# Patient Record
Sex: Female | Born: 1935 | Race: White | Hispanic: No | Smoking: Former smoker
Health system: Southern US, Community
[De-identification: ages and names within clinical notes are randomized; demographics above are authoritative.]

## PROBLEM LIST (undated history)

## (undated) DIAGNOSIS — C4492 Squamous cell carcinoma of skin, unspecified: Secondary | ICD-10-CM

## (undated) DIAGNOSIS — N183 Chronic kidney disease, stage 3 unspecified: Secondary | ICD-10-CM

## (undated) DIAGNOSIS — K5790 Diverticulosis of intestine, part unspecified, without perforation or abscess without bleeding: Secondary | ICD-10-CM

## (undated) DIAGNOSIS — G44009 Cluster headache syndrome, unspecified, not intractable: Secondary | ICD-10-CM

## (undated) DIAGNOSIS — M4800 Spinal stenosis, site unspecified: Secondary | ICD-10-CM

## (undated) DIAGNOSIS — N6009 Solitary cyst of unspecified breast: Secondary | ICD-10-CM

## (undated) DIAGNOSIS — N200 Calculus of kidney: Secondary | ICD-10-CM

## (undated) DIAGNOSIS — F0390 Unspecified dementia without behavioral disturbance: Secondary | ICD-10-CM

## (undated) DIAGNOSIS — R319 Hematuria, unspecified: Secondary | ICD-10-CM

## (undated) DIAGNOSIS — R413 Other amnesia: Secondary | ICD-10-CM

## (undated) DIAGNOSIS — M1990 Unspecified osteoarthritis, unspecified site: Secondary | ICD-10-CM

## (undated) DIAGNOSIS — E785 Hyperlipidemia, unspecified: Secondary | ICD-10-CM

## (undated) DIAGNOSIS — M8580 Other specified disorders of bone density and structure, unspecified site: Secondary | ICD-10-CM

## (undated) DIAGNOSIS — I10 Essential (primary) hypertension: Secondary | ICD-10-CM

## (undated) DIAGNOSIS — F419 Anxiety disorder, unspecified: Secondary | ICD-10-CM

## (undated) HISTORY — DX: Hematuria, unspecified: R31.9

## (undated) HISTORY — DX: Solitary cyst of unspecified breast: N60.09

## (undated) HISTORY — DX: Hyperlipidemia, unspecified: E78.5

## (undated) HISTORY — DX: Chronic kidney disease, stage 3 unspecified: N18.30

## (undated) HISTORY — DX: Diverticulosis of intestine, part unspecified, without perforation or abscess without bleeding: K57.90

## (undated) HISTORY — DX: Spinal stenosis, site unspecified: M48.00

## (undated) HISTORY — DX: Anxiety disorder, unspecified: F41.9

## (undated) HISTORY — DX: Unspecified osteoarthritis, unspecified site: M19.90

## (undated) HISTORY — DX: Squamous cell carcinoma of skin, unspecified: C44.92

## (undated) HISTORY — DX: Cluster headache syndrome, unspecified, not intractable: G44.009

## (undated) HISTORY — DX: Other specified disorders of bone density and structure, unspecified site: M85.80

## (undated) HISTORY — DX: Essential (primary) hypertension: I10

## (undated) HISTORY — DX: Chronic kidney disease, stage 3 (moderate): N18.3

## (undated) HISTORY — DX: Other amnesia: R41.3

## (undated) HISTORY — DX: Calculus of kidney: N20.0

## (undated) HISTORY — PX: APPENDECTOMY: SHX54

---

## 1968-05-17 DIAGNOSIS — N200 Calculus of kidney: Secondary | ICD-10-CM

## 1968-05-17 HISTORY — DX: Calculus of kidney: N20.0

## 1975-05-18 HISTORY — PX: CHOLECYSTECTOMY: SHX55

## 1993-01-15 DIAGNOSIS — N6009 Solitary cyst of unspecified breast: Secondary | ICD-10-CM

## 1993-01-15 HISTORY — DX: Solitary cyst of unspecified breast: N60.09

## 1997-08-29 ENCOUNTER — Other Ambulatory Visit: Admission: RE | Admit: 1997-08-29 | Discharge: 1997-08-29 | Payer: Self-pay | Admitting: Obstetrics and Gynecology

## 1998-09-15 ENCOUNTER — Other Ambulatory Visit: Admission: RE | Admit: 1998-09-15 | Discharge: 1998-09-15 | Payer: Self-pay | Admitting: Internal Medicine

## 1999-06-09 ENCOUNTER — Encounter: Payer: Self-pay | Admitting: Family Medicine

## 1999-06-09 ENCOUNTER — Encounter: Admission: RE | Admit: 1999-06-09 | Discharge: 1999-06-09 | Payer: Self-pay | Admitting: Family Medicine

## 1999-06-12 ENCOUNTER — Encounter: Admission: RE | Admit: 1999-06-12 | Discharge: 1999-06-12 | Payer: Self-pay | Admitting: Family Medicine

## 1999-06-12 ENCOUNTER — Encounter: Payer: Self-pay | Admitting: Family Medicine

## 1999-10-08 ENCOUNTER — Other Ambulatory Visit: Admission: RE | Admit: 1999-10-08 | Discharge: 1999-10-08 | Payer: Self-pay | Admitting: Obstetrics and Gynecology

## 2000-10-11 ENCOUNTER — Other Ambulatory Visit: Admission: RE | Admit: 2000-10-11 | Discharge: 2000-10-11 | Payer: Self-pay | Admitting: Obstetrics and Gynecology

## 2001-10-12 ENCOUNTER — Other Ambulatory Visit: Admission: RE | Admit: 2001-10-12 | Discharge: 2001-10-12 | Payer: Self-pay | Admitting: Obstetrics and Gynecology

## 2004-01-10 ENCOUNTER — Other Ambulatory Visit: Admission: RE | Admit: 2004-01-10 | Discharge: 2004-01-10 | Payer: Self-pay | Admitting: Obstetrics and Gynecology

## 2005-04-28 ENCOUNTER — Other Ambulatory Visit: Admission: RE | Admit: 2005-04-28 | Discharge: 2005-04-28 | Payer: Self-pay | Admitting: Obstetrics and Gynecology

## 2006-08-18 ENCOUNTER — Other Ambulatory Visit: Admission: RE | Admit: 2006-08-18 | Discharge: 2006-08-18 | Payer: Self-pay | Admitting: Obstetrics & Gynecology

## 2006-09-29 ENCOUNTER — Encounter: Admission: RE | Admit: 2006-09-29 | Discharge: 2006-09-29 | Payer: Self-pay | Admitting: Rheumatology

## 2007-08-24 ENCOUNTER — Other Ambulatory Visit: Admission: RE | Admit: 2007-08-24 | Discharge: 2007-08-24 | Payer: Self-pay | Admitting: Obstetrics & Gynecology

## 2007-12-28 ENCOUNTER — Ambulatory Visit (HOSPITAL_COMMUNITY): Admission: RE | Admit: 2007-12-28 | Discharge: 2007-12-28 | Payer: Self-pay | Admitting: Internal Medicine

## 2008-09-02 ENCOUNTER — Other Ambulatory Visit: Admission: RE | Admit: 2008-09-02 | Discharge: 2008-09-02 | Payer: Self-pay | Admitting: Obstetrics & Gynecology

## 2008-12-17 ENCOUNTER — Encounter: Admission: RE | Admit: 2008-12-17 | Discharge: 2008-12-17 | Payer: Self-pay | Admitting: Neurology

## 2008-12-18 ENCOUNTER — Encounter: Admission: RE | Admit: 2008-12-18 | Discharge: 2009-02-11 | Payer: Self-pay | Admitting: Neurology

## 2009-08-15 DIAGNOSIS — C4492 Squamous cell carcinoma of skin, unspecified: Secondary | ICD-10-CM

## 2009-08-15 HISTORY — DX: Squamous cell carcinoma of skin, unspecified: C44.92

## 2009-08-15 HISTORY — PX: SKIN BIOPSY: SHX1

## 2010-09-15 HISTORY — PX: BREAST BIOPSY: SHX20

## 2010-09-23 ENCOUNTER — Other Ambulatory Visit: Payer: Self-pay | Admitting: Radiology

## 2011-02-12 LAB — COMPREHENSIVE METABOLIC PANEL
ALT: 32
Alkaline Phosphatase: 52
CO2: 29
Chloride: 97
Glucose, Bld: 116 — ABNORMAL HIGH
Potassium: 4.3
Sodium: 135
Total Protein: 6.5

## 2011-02-12 LAB — CBC
Hemoglobin: 12.7
RBC: 4
WBC: 6.3

## 2011-07-09 ENCOUNTER — Encounter: Payer: Self-pay | Admitting: Internal Medicine

## 2011-09-06 ENCOUNTER — Telehealth: Payer: Self-pay | Admitting: Internal Medicine

## 2011-09-06 NOTE — Telephone Encounter (Signed)
Left a message for Alicia Harrison to call me. 

## 2011-09-07 NOTE — Telephone Encounter (Signed)
Spoke with Alicia Harrison and scheduled patient with Willette Cluster, NP on 09/08/11 at 10:30 AM. Alicia Harrison to fax records.

## 2011-09-08 ENCOUNTER — Other Ambulatory Visit (INDEPENDENT_AMBULATORY_CARE_PROVIDER_SITE_OTHER): Payer: Medicare Other

## 2011-09-08 ENCOUNTER — Encounter: Payer: Self-pay | Admitting: Nurse Practitioner

## 2011-09-08 ENCOUNTER — Ambulatory Visit (INDEPENDENT_AMBULATORY_CARE_PROVIDER_SITE_OTHER): Payer: Medicare Other | Admitting: Nurse Practitioner

## 2011-09-08 VITALS — BP 122/70 | HR 72 | Ht 66.0 in | Wt 141.0 lb

## 2011-09-08 DIAGNOSIS — K921 Melena: Secondary | ICD-10-CM

## 2011-09-08 LAB — CBC WITH DIFFERENTIAL/PLATELET
Eosinophils Absolute: 0.1 10*3/uL (ref 0.0–0.7)
Eosinophils Relative: 0.3 % (ref 0.0–5.0)
MCV: 94.4 fl (ref 78.0–100.0)
Monocytes Absolute: 0.8 10*3/uL (ref 0.1–1.0)
Neutrophils Relative %: 86.1 % — ABNORMAL HIGH (ref 43.0–77.0)
Platelets: 281 10*3/uL (ref 150.0–400.0)
WBC: 18.3 10*3/uL (ref 4.5–10.5)

## 2011-09-08 NOTE — Progress Notes (Signed)
09/08/2011 Alicia Harrison [id number] 01-May-1926   HISTORY OF PRESENT ILLNESS: Patient is an 78 year old female referred by her PCP for evaluation of heme positive stools. Patient had a screening colonoscopy by Dr. Juanda Chance in 2003. Findings included only diverticulosis. Patient has no GI complaints such as abdominal pain, nausea, weight loss or overt GI bleeding.   Past Medical History  Diagnosis Date  . Hyperlipemia   . Spinal stenosis   . Cluster headaches   . Kidney stones 1970  . Osteopenia   . Allergic rhinitis   . Chronic kidney disease (CKD), stage III (moderate)    Past Surgical History  Procedure Date  . Cholecystectomy 1977    reports that she has quit smoking. She has never used smokeless tobacco. She reports that she drinks alcohol. She reports that she does not use illicit drugs. family history includes Melanoma in her mother. No Known Allergies    Outpatient Encounter Prescriptions as of 09/08/2011  Medication Sig Dispense Refill  . etodolac (LODINE) 400 MG tablet Take 400 mg by mouth daily.      . hydrochlorothiazide (HYDRODIURIL) 25 MG tablet Take 25 mg by mouth daily.      Marland Kitchen loratadine (CLARITIN) 10 MG tablet Take 10 mg by mouth daily.         REVIEW OF SYSTEMS  : All other systems reviewed and negative except where noted in the History of Present Illness.   PHYSICAL EXAM: BP 122/70  Pulse 72  Ht 5\' 6"  (1.676 m)  Wt 141 lb (63.957 kg)  BMI 22.76 kg/m2 General: Well developed white female in no acute distress Head: Normocephalic and atraumatic Eyes:  sclerae anicteric,conjunctive pink. Ears: Normal auditory acuity Mouth: No deformity or lesions Neck: Supple, no masses.  Lungs: Clear throughout to auscultation Heart: Regular rate and rhythm Abdomen: Soft, non distended, nontender. No masses or hepatomegaly noted. Normal Bowel sounds Rectal: light brown heme negative stool Musculoskeletal: Symmetrical with no gross deformities  Skin: No lesions on  visible extremities Extremities: No edema or deformities noted Neurological: Alert oriented, grossly nonfocal Cervical Nodes:  No significant cervical adenopathy Psychological:  Alert and cooperative. Normal mood and affect  ASSESSMENT AND PLAN;   Heme positive stool (positive Hemosure by PCP), normal hemoglobin in 13 range a couple of weeks ago. Patient heme negative on exam today. Will recheck her hemoglobin to make sure it isn't drifting. Patient takes NSAIDS so she could have intermittent blood loss from erosions / ulcers. Patient is due for her screening colonoscopy but she really isn't interested. We will call her with lab results. If her hemoglobin has drifted she may want to reconsider having a colonoscopy and possibly and upper endoscopy as well.

## 2011-09-08 NOTE — Patient Instructions (Signed)
Please go to the basement level to have your labs drawn.  We will call you with the results. 

## 2011-09-14 ENCOUNTER — Other Ambulatory Visit: Payer: Self-pay | Admitting: *Deleted

## 2011-09-14 DIAGNOSIS — K921 Melena: Secondary | ICD-10-CM

## 2011-09-14 NOTE — Progress Notes (Signed)
I agree with assessment and plan.

## 2011-09-30 ENCOUNTER — Telehealth: Payer: Self-pay | Admitting: *Deleted

## 2011-09-30 NOTE — Telephone Encounter (Signed)
Spoke with patient and she will come next week for labs. 

## 2011-09-30 NOTE — Telephone Encounter (Signed)
Message copied by Daphine Deutscher on Thu Sep 30, 2011  9:23 AM ------      Message from: Daphine Deutscher      Created: Thu Sep 16, 2011  9:37 AM       Call and remind patient due for CBC on 5/20(PG()

## 2011-10-05 ENCOUNTER — Other Ambulatory Visit (INDEPENDENT_AMBULATORY_CARE_PROVIDER_SITE_OTHER): Payer: Medicare Other

## 2011-10-05 DIAGNOSIS — K921 Melena: Secondary | ICD-10-CM

## 2011-10-05 LAB — CBC WITH DIFFERENTIAL/PLATELET
Basophils Absolute: 0 10*3/uL (ref 0.0–0.1)
Eosinophils Absolute: 0.2 10*3/uL (ref 0.0–0.7)
Hemoglobin: 12.6 g/dL (ref 12.0–15.0)
Lymphocytes Relative: 25.9 % (ref 12.0–46.0)
MCHC: 33 g/dL (ref 30.0–36.0)
Monocytes Absolute: 0.5 10*3/uL (ref 0.1–1.0)
Neutro Abs: 4.4 10*3/uL (ref 1.4–7.7)
RDW: 13.6 % (ref 11.5–14.6)

## 2012-01-04 ENCOUNTER — Ambulatory Visit (INDEPENDENT_AMBULATORY_CARE_PROVIDER_SITE_OTHER): Payer: Medicare Other | Admitting: Internal Medicine

## 2012-01-04 VITALS — BP 118/70 | HR 83 | Temp 97.7°F | Resp 16 | Ht 66.0 in | Wt 138.0 lb

## 2012-01-04 DIAGNOSIS — S81809A Unspecified open wound, unspecified lower leg, initial encounter: Secondary | ICD-10-CM

## 2012-01-04 DIAGNOSIS — IMO0002 Reserved for concepts with insufficient information to code with codable children: Secondary | ICD-10-CM

## 2012-01-04 DIAGNOSIS — L03119 Cellulitis of unspecified part of limb: Secondary | ICD-10-CM

## 2012-01-04 MED ORDER — MUPIROCIN 2 % EX OINT: TOPICAL_OINTMENT | Freq: Three times a day (TID) | CUTANEOUS | Status: AC

## 2012-01-04 MED ORDER — DOXYCYCLINE HYCLATE 100 MG PO TABS: 100.0000 mg | ORAL_TABLET | Freq: Two times a day (BID) | ORAL | Status: AC

## 2012-01-04 NOTE — Patient Instructions (Signed)
Wound Care Wound care helps prevent pain and infection.  You may need a tetanus shot if:  You cannot remember when you had your last tetanus shot.   You have never had a tetanus shot.   The injury broke your skin.  If you need a tetanus shot and you choose not to have one, you may get tetanus. Sickness from tetanus can be serious. HOME CARE   Only take medicine as told by your doctor.   Clean the wound daily with mild soap and water.   Change any bandages (dressings) as told by your doctor.   Put medicated cream and a bandage on the wound as told by your doctor.   Change the bandage if it gets wet, dirty, or starts to smell.   Take showers. Do not take baths, swim, or do anything that puts your wound under water.   Rest and raise (elevate) the wound until the pain and puffiness (swelling) are better.   Keep all doctor visits as told.  GET HELP RIGHT AWAY IF:   Yellowish-white fluid (pus) comes from the wound.   Medicine does not lessen your pain.   There is a red streak going away from the wound.   You cannot move your finger or toe.   You have a fever.  MAKE SURE YOU:   Understand these instructions.   Will watch your condition.   Will get help right away if you are not doing well or get worse.  Document Released: 02/10/2008 Document Revised: 04/22/2011 Document Reviewed: 09/06/2010 [employer] Patient Information 2012 [employer]. 

## 2012-01-04 NOTE — Progress Notes (Signed)
  Subjective:    Patient ID: Alicia Harrison, female    DOB: [birth year], 78 y.o.   MRN: [id number]  HPI 12 days ago hit shin on a rock, has a deep abrasion,no pain, started turning red around edges and came in for evaluation. NMS intact   Review of Systems arthritis    Objective:   Physical Exam  Skin: Skin is warm and dry. Abrasion and laceration noted. There is erythema.          Healing wound with possible cellulitis mild on edges   Right leg wound  Soap and water/mupirocin and cover. TD utd     Assessment & Plan:  Daily wound care Mupirocin/doxy

## 2012-09-11 ENCOUNTER — Encounter: Payer: Self-pay | Admitting: *Deleted

## 2012-09-12 ENCOUNTER — Encounter: Payer: Self-pay | Admitting: *Deleted

## 2012-10-24 ENCOUNTER — Encounter: Payer: Self-pay | Admitting: Internal Medicine

## 2012-10-24 ENCOUNTER — Ambulatory Visit (INDEPENDENT_AMBULATORY_CARE_PROVIDER_SITE_OTHER): Payer: Medicare Other | Admitting: Internal Medicine

## 2012-10-24 VITALS — BP 102/68 | HR 76 | Ht 66.0 in | Wt 136.8 lb

## 2012-10-24 DIAGNOSIS — K921 Melena: Secondary | ICD-10-CM

## 2012-10-24 DIAGNOSIS — R195 Other fecal abnormalities: Secondary | ICD-10-CM

## 2012-10-24 HISTORY — PX: COLONOSCOPY: SHX174

## 2012-10-24 MED ORDER — PEG-KCL-NACL-NASULF-NA ASC-C 100 G PO SOLR: 1.0000 | Freq: Once | ORAL | Status: DC

## 2012-10-24 NOTE — Patient Instructions (Addendum)
It has been recommended to you by your physician that you have a(n) Colonoscopy completed. Per your request, we did not schedule the procedure(s) today. Please contact our office at [phone] should you decide to have the procedure completed.    Cc:Dr Tisovec

## 2012-10-24 NOTE — Progress Notes (Signed)
Alicia Harrison 08-16-1925 MRN [id number]        History of Present Illness:  This is a 79 year old white female with a Hemoccult positive stool on Hemosure. She was here one year ago and saw Gunnar Fusi for the same reason of having Hemoccult-positive stool. She at that time declined colonoscopy. He has occasional constipation which bothers her because of spinal stenosis. She does not take any laxatives except for fiber.and yogurt. She denies visible blood per rectum. Her appetite has been good and she denies any nausea vomiting or weight changes. She and her husband leaving for [place] for 3 weeks and will be back in mid July. There is no family history of colon cancer .   Past Medical History  Diagnosis Date  . Hyperlipemia   . Spinal stenosis   . Cluster headaches   . Kidney stones 1970  . Osteopenia   . Allergic rhinitis   . Chronic kidney disease (CKD), stage III (moderate)   . Diverticulosis    Past Surgical History  Procedure Laterality Date  . Cholecystectomy  1977    reports that she has quit smoking. She has never used smokeless tobacco. She reports that  drinks alcohol. She reports that she does not use illicit drugs. family history includes Melanoma in her mother and Schizophrenia in her son. No Known Allergies      Review of Systems:Denies heartburn dysphagia abdominal pain  The remainder of the 10 point ROS is negative except as outlined in H&P   Physical Exam: General appearance  Well developed, in no distress. Eyes- non icteric. HEENT nontraumatic, normocephalic. Mouth no lesions, tongue papillated, no cheilosis. Neck supple without adenopathy, thyroid not enlarged, no carotid bruits, no JVD. Lungs Clear to auscultation bilaterally. Cor normal S1, normal S2, regular rhythm, no murmur,  quiet precordium. Abdomen:Soft relaxed with normoactive bowel sounds. Prominent pulsating aorta without bruits. Liver edge at costal margin. Minimal tenderness surrounding the  aorta. Lower abdomen unremarkable without any mass  Rectal:Soft Hemoccult negative stool  Extremities no pedal edema. Skin no lesions. Neurological alert and oriented x 3. Psychological normal mood and affect.  Assessment and Plan:  79 year old white female with  2 home tests possitive for occult blood in April  2013 and again  this year. She  declined colonoscopy in 2013 .  She is not anemic. Her hemoglobin 13.0 and there is no family history of colon cancer. I could not reproduce the Hemoccult-positive stool on today's exam. She has mild renal insufficiency with creatinine 1.6 and BUN 28. She takes Lodine 400 mg by mouth daily which could cause gastropathy and heme positive stool. She takes Lodine forchronic low back pain together with Vicodin. And 81 mg aspirin. I have discussed the prep for colonoscopy as well as sedation and the procedure itself . She decided not to go ahead with colonoscopy. She is concerned about higher risks exceeding the benefits at her age. I have brought up the fact that this is the second time she was found to have blood in her stool. She initially agreed to schedule but then changed her mind. There are currently no plans for screening colonoscopy for this patient   10/24/2012 Lina Sar

## 2012-11-28 ENCOUNTER — Telehealth: Payer: Self-pay | Admitting: *Deleted

## 2012-11-28 NOTE — Telephone Encounter (Signed)
Per Dr. Edward Jolly may take out of hold mammogram/ 11/21/2012/sue

## 2012-12-06 ENCOUNTER — Encounter: Payer: Medicare Other | Admitting: Internal Medicine

## 2013-01-22 ENCOUNTER — Encounter: Payer: Self-pay | Admitting: Obstetrics & Gynecology

## 2013-01-22 ENCOUNTER — Ambulatory Visit (INDEPENDENT_AMBULATORY_CARE_PROVIDER_SITE_OTHER): Payer: Medicare Other | Admitting: Obstetrics & Gynecology

## 2013-01-22 VITALS — BP 128/76 | HR 64 | Resp 20 | Ht 66.0 in | Wt 133.0 lb

## 2013-01-22 DIAGNOSIS — Z01419 Encounter for gynecological examination (general) (routine) without abnormal findings: Secondary | ICD-10-CM

## 2013-01-22 DIAGNOSIS — M8580 Other specified disorders of bone density and structure, unspecified site: Secondary | ICD-10-CM

## 2013-01-22 DIAGNOSIS — M899 Disorder of bone, unspecified: Secondary | ICD-10-CM

## 2013-01-22 NOTE — Patient Instructions (Addendum)

## 2013-01-22 NOTE — Progress Notes (Signed)
79 y.o. Alicia Harrison MarriedCaucasianF here for annual exam.  Doing well.  No vaginal bleeding.  Hasn't been here in 2 years.  Needs BMD and then we will discuss if she needs to restart her Fosamax.  Dr. Wylene Simmer this summer.  Blood work was all okay.  She and spouse went to [place] this summer.  Husband fell the first day they were in [place] and husband fell on an escalator and ended up in ER for stiches.  Went with son and daughter-in-law.   Patient's last menstrual period was 05/17/1988.          Sexually active: no  The current method of family planning is post menopausal status.    Exercising: yes  physical therapy 3 x weekly for 1 hour Smoker:  no  Health Maintenance: Pap:  09/21/10 WNL History of abnormal Pap:  no MMG: 10/10/12-additional views-normal Colonoscopy:  2003, declines another colonoscopy BMD:   4/10 off fosamax x 2 years TDaP:  2011 Screening Labs: PCP, Hb today: PCP, Urine today: PCP   reports that she has quit smoking. She has never used smokeless tobacco. She reports that  drinks alcohol. She reports that she does not use illicit drugs.  Past Medical History  Diagnosis Date  . Hyperlipemia   . Spinal stenosis   . Cluster headaches   . Kidney stones 1970  . Osteopenia   . Allergic rhinitis   . Chronic kidney disease (CKD), stage III (moderate)   . Diverticulosis   . SCC (squamous cell carcinoma) 4/11    of face and leg  . Osteoarthritis   . Hypertension   . Breast cyst 9/94  . Hematuria     Past Surgical History  Procedure Laterality Date  . Cholecystectomy  1977  . Breast biopsy  5/12    stromal fibrosis and microcalcifications  . Appendectomy    . Skin biopsy  4/11     squamous cell ca    Current Outpatient Prescriptions  Medication Sig Dispense Refill  . aspirin 81 MG tablet Take 81 mg by mouth daily.      . Calcium Carbonate-Vitamin D (CALCIUM-D PO) Take 600 mg by mouth 2 (two) times daily.      Marland Kitchen etodolac (LODINE) 400 MG tablet Take 400 mg by mouth  daily.      . hydrochlorothiazide (HYDRODIURIL) 25 MG tablet Take 25 mg by mouth daily.      Marland Kitchen HYDROcodone-acetaminophen (VICODIN) 5-500 MG per tablet Take 1 tablet by mouth every 6 (six) hours as needed for pain.      Marland Kitchen loratadine (CLARITIN) 10 MG tablet Take 10 mg by mouth daily.       No current facility-administered medications for this visit.    Family History  Problem Relation Age of Onset  . Melanoma Mother   . Schizophrenia Son   . Heart attack Maternal Grandfather     ROS:  Pertinent items are noted in HPI.  Otherwise, a comprehensive ROS was negative.  Exam:   BP 128/76  Pulse 64  Resp 20  Ht 5\' 6"  (1.676 m)  Wt 133 lb (60.328 kg)  BMI 21.48 kg/m2  LMP 05/17/1988  Weight change: @WEIGHTCHANGE @ Height:   Height: 5\' 6"  (167.6 cm) (with shoes)  Ht Readings from Last 3 Encounters:  01/22/13 5\' 6"  (1.676 m)  10/24/12 5\' 6"  (1.676 m)  01/04/12 5\' 6"  (1.676 m)    General appearance: alert, cooperative and appears stated age Head: Normocephalic, without obvious abnormality, atraumatic Neck: no  adenopathy, supple, symmetrical, trachea midline and thyroid normal to inspection and palpation Lungs: clear to auscultation bilaterally Breasts: normal appearance, no masses or tenderness Heart: regular rate and rhythm Abdomen: soft, non-tender; bowel sounds normal; no masses,  no organomegaly Extremities: extremities normal, atraumatic, no cyanosis or edema Skin: Skin color, texture, turgor normal. No rashes or lesions Lymph nodes: Cervical, supraclavicular, and axillary nodes normal. No abnormal inguinal nodes palpated Neurologic: Grossly normal   Pelvic: External genitalia:  no lesions              Urethra:  normal appearing urethra with no masses, tenderness or lesions              Bartholins and Skenes: normal                 Vagina: normal appearing vagina with normal color and discharge, no lesions              Cervix: no lesions              Pap taken: no Bimanual  Exam:  Uterus:  normal size, contour, position, consistency, mobility, non-tender              Adnexa: normal adnexa and no mass, fullness, tenderness               Rectovaginal: Confirms               Anus:  normal sphincter tone, no lesions  A:  Well Woman with normal exam PMP, No HRT Osteopenia Microscopic hematuria, h/o renal calculi  P:   Mammogram yearly pap smear not indicated.  Normal 2012. BMD scheduled for pt 9/18 at 11:30am at [facility] return annually or prn  An After Visit Summary was printed and given to the patient.

## 2013-03-01 ENCOUNTER — Telehealth: Payer: Self-pay | Admitting: Obstetrics & Gynecology

## 2013-03-01 NOTE — Telephone Encounter (Signed)
Patient calling for: Bone density testing results.  Wants to know if she should continue Fosamax.   Called [facility] and requesting copy of DEXA. Should be faxing DEXA at this time.

## 2013-03-01 NOTE — Telephone Encounter (Signed)
Pt calling for results of her colonoscopy.

## 2013-03-02 NOTE — Telephone Encounter (Signed)
Waited for fax yesterday it did not come. Called [facility] again now, states they will fax it when they can.

## 2013-03-06 NOTE — Telephone Encounter (Signed)
Dr.Miller, have you received these results? 

## 2013-03-10 NOTE — Telephone Encounter (Signed)
FYI.  I have signed off everything in my office/files and I do not have this BMD result.  I am sorry.

## 2013-03-12 NOTE — Telephone Encounter (Signed)
Note from Dr. Hyacinth Meeker:   BMD Very stable. Repeat in 3-5 years.   Message left to return call to [place] at [phone].   Will give message from Dr. Hyacinth Meeker.

## 2013-03-12 NOTE — Telephone Encounter (Signed)
Dr. Hyacinth Meeker, patient wants to know if she should continue on Fosamax. She states she was told to hold on taking it and wants to know if she should continue taking it?

## 2013-03-19 NOTE — Telephone Encounter (Signed)
Dr. Hyacinth Meeker, do you want patient to re-start Fosamax?

## 2013-03-26 NOTE — Telephone Encounter (Signed)
Spoke with pt to advise she can take a break from Fosamax, and repeat BMD in 2-3 years. Pt agreeable.

## 2013-03-26 NOTE — Telephone Encounter (Signed)
OK for pt to take drug holiday from Fosamax.  Will repeat BMD 2-3 years.  OK to stop medication now.

## 2013-03-27 ENCOUNTER — Telehealth: Payer: Self-pay | Admitting: Emergency Medicine

## 2013-03-27 NOTE — Telephone Encounter (Signed)
Encounter opened erroneously.   Closed encounter.   

## 2013-04-16 ENCOUNTER — Encounter: Payer: Self-pay | Admitting: Obstetrics & Gynecology

## 2014-02-25 ENCOUNTER — Telehealth: Payer: Self-pay | Admitting: Obstetrics & Gynecology

## 2014-02-25 NOTE — Telephone Encounter (Signed)
Patient is asking if she needs to be seen for her aex this year. Patient thinks she may not need an aex every year.

## 2014-02-26 NOTE — Telephone Encounter (Signed)
Patient was last seen for aex 01/2013. Patient's last pap was in 2012 which was normal. Patient is due for annual exam but would like to know if she needs one yearly at this time. Please advise.

## 2014-02-27 NOTE — Telephone Encounter (Signed)
Left message to call Cathrine Krizan at [phone]. 

## 2014-02-27 NOTE — Telephone Encounter (Signed)
Pt seen 2014.  OK to wait until next year.  She should call with any new pelvic pain symptoms or vaginal bleeding, in particular.  Thanks.

## 2014-03-05 NOTE — Telephone Encounter (Signed)
Spoke with patient. Advised of message as seen below from [place]. Patient is agreeable and will call if she has any pelvic pain, vaginal bleeding, or any other symptoms. Would like to call back next year to schedule aex appointment with [facility].  Routing to provider for final review. Patient agreeable to disposition. Will close encounter

## 2014-03-18 ENCOUNTER — Encounter: Payer: Self-pay | Admitting: Obstetrics & Gynecology

## 2014-08-28 ENCOUNTER — Ambulatory Visit (INDEPENDENT_AMBULATORY_CARE_PROVIDER_SITE_OTHER): Payer: Medicare Other | Admitting: Emergency Medicine

## 2014-08-28 VITALS — BP 126/74 | HR 90 | Temp 97.9°F | Resp 18 | Ht 68.0 in | Wt 128.0 lb

## 2014-08-28 DIAGNOSIS — J0140 Acute pansinusitis, unspecified: Secondary | ICD-10-CM

## 2014-08-28 MED ORDER — AMOXICILLIN-POT CLAVULANATE 875-125 MG PO TABS: 1.0000 | ORAL_TABLET | Freq: Two times a day (BID) | ORAL | Status: DC

## 2014-08-28 NOTE — Patient Instructions (Signed)

## 2014-08-28 NOTE — Progress Notes (Signed)
[facility] [address], [place] [address] [phone]  Date:  08/28/2014   Name:  Alicia Harrison   DOB:  08-Aug-1925   MRN:  [id number]  PCP:  Haywood Pao, MD    Chief Complaint: Nasal Congestion   History of Present Illness:  Alicia Harrison is a 81 y.o. very pleasant female patient who presents with the following:  Ill with nasal congestion and pressure in cheeks and forehead Post nasal drainage and purulent discharge No wheezing or shortness of breath No fever or chills.  No nausea or vomiting Husband ill with similar. No improvement with over the counter medications or other home remedies.  Denies other complaint or health concern today.   Patient Active Problem List   Diagnosis Date Noted  . Blood in stool 09/08/2011    Past Medical History  Diagnosis Date  . Hyperlipemia   . Spinal stenosis   . Cluster headaches   . Kidney stones 1970  . Osteopenia   . Allergic rhinitis   . Chronic kidney disease (CKD), stage III (moderate)   . Diverticulosis   . SCC (squamous cell carcinoma) 4/11    of face and leg  . Osteoarthritis   . Hypertension   . Breast cyst 9/94  . Hematuria     evaluation showed stones but still with hematuria  . Anxiety     Past Surgical History  Procedure Laterality Date  . Cholecystectomy  1977  . Breast biopsy  5/12    stromal fibrosis and microcalcifications  . Appendectomy    . Skin biopsy  4/11     squamous cell ca    History  Substance Use Topics  . Smoking status: Former Research scientist (life sciences)  . Smokeless tobacco: Never Used  . Alcohol Use: Yes    Family History  Problem Relation Age of Onset  . Melanoma Mother   . Schizophrenia Son   . Heart attack Maternal Grandfather     No Known Allergies  Medication list has been reviewed and updated.  Current Outpatient Prescriptions on File Prior to Visit  Medication Sig Dispense Refill  . aspirin 81 MG tablet Take 81 mg by mouth daily.    . Calcium  Carbonate-Vitamin D (CALCIUM-D PO) Take 600 mg by mouth 2 (two) times daily.    Marland Kitchen etodolac (LODINE) 400 MG tablet Take 400 mg by mouth daily.    . hydrochlorothiazide (HYDRODIURIL) 25 MG tablet Take 25 mg by mouth daily.    Marland Kitchen HYDROcodone-acetaminophen (VICODIN) 5-500 MG per tablet Take 1 tablet by mouth every 6 (six) hours as needed for pain.    Marland Kitchen loratadine (CLARITIN) 10 MG tablet Take 10 mg by mouth daily.     No current facility-administered medications on file prior to visit.    Review of Systems:  As per HPI, otherwise negative.    Physical Examination: Filed Vitals:   08/28/14 1625  BP: 126/74  Pulse: 90  Temp: 97.9 F (36.6 C)  Resp: 18   Filed Vitals:   08/28/14 1625  Height: 5\' 8"  (1.727 m)  Weight: 128 lb (58.06 kg)   Body mass index is 19.47 kg/(m^2). Ideal Body Weight: Weight in (lb) to have BMI = 25: 164.1  GEN: WDWN, NAD, Non-toxic, A & O x 3  hoarse HEENT: Atraumatic, Normocephalic. Neck supple. No masses, No LAD. Ears and Nose: No external deformity. CV: RRR, No M/G/R. No JVD. No thrill. No extra heart sounds. PULM: CTA B, no wheezes,  crackles, rhonchi. No retractions. No resp. distress. No accessory muscle use. ABD: S, NT, ND, +BS. No rebound. No HSM. EXTR: No c/c/e NEURO Normal gait.  PSYCH: Normally interactive. Conversant. Not depressed or anxious appearing.  Calm demeanor.    Assessment and Plan: Sinusitis augmentin coricidin HBP   Signed,  Ellison Carwin, MD

## 2015-11-04 ENCOUNTER — Encounter: Payer: Self-pay | Admitting: Neurology

## 2015-11-04 ENCOUNTER — Telehealth: Payer: Self-pay | Admitting: Neurology

## 2015-11-04 ENCOUNTER — Ambulatory Visit (INDEPENDENT_AMBULATORY_CARE_PROVIDER_SITE_OTHER): Payer: Medicare Other | Admitting: Neurology

## 2015-11-04 VITALS — BP 120/58 | HR 89 | Ht 66.0 in | Wt 126.0 lb

## 2015-11-04 DIAGNOSIS — G8929 Other chronic pain: Secondary | ICD-10-CM | POA: Diagnosis not present

## 2015-11-04 DIAGNOSIS — F05 Delirium due to known physiological condition: Secondary | ICD-10-CM

## 2015-11-04 DIAGNOSIS — M549 Dorsalgia, unspecified: Secondary | ICD-10-CM

## 2015-11-04 DIAGNOSIS — N189 Chronic kidney disease, unspecified: Secondary | ICD-10-CM | POA: Insufficient documentation

## 2015-11-04 DIAGNOSIS — R251 Tremor, unspecified: Secondary | ICD-10-CM

## 2015-11-04 DIAGNOSIS — R413 Other amnesia: Secondary | ICD-10-CM

## 2015-11-04 DIAGNOSIS — R4189 Other symptoms and signs involving cognitive functions and awareness: Secondary | ICD-10-CM | POA: Diagnosis not present

## 2015-11-04 DIAGNOSIS — E538 Deficiency of other specified B group vitamins: Secondary | ICD-10-CM

## 2015-11-04 DIAGNOSIS — I10 Essential (primary) hypertension: Secondary | ICD-10-CM | POA: Insufficient documentation

## 2015-11-04 DIAGNOSIS — R410 Disorientation, unspecified: Secondary | ICD-10-CM

## 2015-11-04 MED ORDER — DONEPEZIL HCL 10 MG PO TABS: 10.0000 mg | ORAL_TABLET | Freq: Every day | ORAL | Status: DC

## 2015-11-04 NOTE — Telephone Encounter (Signed)
Called and spoke to pt. Answered questions. I clarified that aricept was the same as donepezil. She verbalized understanding. I updated her DPR form to include contact information for her sons.

## 2015-11-04 NOTE — Patient Instructions (Addendum)
Remember to drink plenty of fluid, eat healthy meals and do not skip any meals. Try to eat protein with a every meal and eat a healthy snack such as fruit or nuts in between meals. Try to keep a regular sleep-wake schedule and try to exercise daily, particularly in the form of walking, 20-30 minutes a day, if you can.   As far as your medications are concerned, I would like to suggest: Aricept. Start with 1/2 pill and in 2 weeks can increase to a whole pill.  As far as diagnostic testing: MRI brain, labs  I would like to see you back in 4 months, sooner if we need to. Please call us with any interim questions, concerns, problems, updates or refill requests.   Our phone number is [phone]. We also have an after hours call service for urgent matters and there is a physician on-call for urgent questions. For any emergencies you know to call 911 or go to the nearest emergency room  Donepezil tablets What is this medicine? DONEPEZIL (doe NEP e zil) is used to treat mild to moderate dementia caused by Alzheimer's disease. This medicine may be used for other purposes; ask your health care provider or pharmacist if you have questions. What should I tell my health care provider before I take this medicine? They need to know if you have any of these conditions: -asthma or other lung disease -difficulty passing urine -head injury -heart disease -history of irregular heartbeat -liver disease -seizures (convulsions) -stomach or intestinal disease, ulcers or stomach bleeding -an unusual or allergic reaction to donepezil, other medicines, foods, dyes, or preservatives -pregnant or trying to get pregnant -breast-feeding How should I use this medicine? Take this medicine by mouth with a glass of water. Follow the directions on the prescription label. You may take this medicine with or without food. Take this medicine at regular intervals. This medicine is usually taken before bedtime. Do not take it  more often than directed. Continue to take your medicine even if you feel better. Do not stop taking except on your doctor's advice. If you are taking the 23 mg donepezil tablet, swallow it whole; do not cut, crush, or chew it. Talk to your pediatrician regarding the use of this medicine in children. Special care may be needed. Overdosage: If you think you have taken too much of this medicine contact a poison control center or emergency room at once. NOTE: This medicine is only for you. Do not share this medicine with others. What if I miss a dose? If you miss a dose, take it as soon as you can. If it is almost time for your next dose, take only that dose, do not take double or extra doses. What may interact with this medicine? Do not take this medicine with any of the following medications: -certain medicines for fungal infections like itraconazole, fluconazole, posaconazole, and voriconazole -cisapride -dextromethorphan; quinidine -dofetilide -dronedarone -pimozide -quinidine -thioridazine -ziprasidone This medicine may also interact with the following medications: -antihistamines for allergy, cough and cold -atropine -bethanechol -carbamazepine -certain medicines for bladder problems like oxybutynin, tolterodine -certain medicines for Parkinson's disease like benztropine, trihexyphenidyl -certain medicines for stomach problems like dicyclomine, hyoscyamine -certain medicines for travel sickness like scopolamine -dexamethasone -ipratropium -NSAIDs, medicines for pain and inflammation, like ibuprofen or naproxen -other medicines for Alzheimer's disease -other medicines that prolong the QT interval (cause an abnormal heart rhythm) -phenobarbital -phenytoin -rifampin, rifabutin or rifapentine This list may not describe all possible interactions. Give your health  care provider a list of all the medicines, herbs, non-prescription drugs, or dietary supplements you use. Also tell them  if you smoke, drink alcohol, or use illegal drugs. Some items may interact with your medicine. What should I watch for while using this medicine? Visit your doctor or health care professional for regular checks on your progress. Check with your doctor or health care professional if your symptoms do not get better or if they get worse. You may get drowsy or dizzy. Do not drive, use machinery, or do anything that needs mental alertness until you know how this drug affects you. What side effects may I notice from receiving this medicine? Side effects that you should report to your doctor or health care professional as soon as possible: -allergic reactions like skin rash, itching or hives, swelling of the face, lips, or tongue -changes in vision -feeling faint or lightheaded, falls -problems with balance -redness, blistering, peeling or loosening of the skin, including inside the mouth -slow heartbeat, or palpitations -stomach pain -unusual bleeding or bruising, red or purple spots on the skin -vomiting -weight loss Side effects that usually do not require medical attention (report to your doctor or health care professional if they continue or are bothersome): -diarrhea, especially when starting treatment -headache -indigestion or heartburn -loss of appetite -muscle cramps -nausea This list may not describe all possible side effects. Call your doctor for medical advice about side effects. You may report side effects to FDA at 1-800-FDA-1088. Where should I keep my medicine? Keep out of reach of children. Store at room temperature between 15 and 30 degrees C (59 and 86 degrees F). Throw away any unused medicine after the expiration date. NOTE: This sheet is a summary. It may not cover all possible information. If you have questions about this medicine, talk to your doctor, pharmacist, or health care provider.    2016, Elsevier/Gold Standard. (2013-12-13 07:51:52)

## 2015-11-04 NOTE — Progress Notes (Addendum)
[facility]    Provider:  Dr Jaynee Eagles Referring Provider: Osborne Casco Fransico Him, MD Primary Care Physician:  Haywood Pao, MD  CC:  Short-term memory changes  HPI:  Alicia Harrison is a lovely 82 y.o. female here as a referral from Dr. Osborne Casco for short-term memory changes. PMHx HLD, spinal stenosis, cluster ha, kidney stones, CKD, memory loss, HTN, DDD lumbar. Hld. She has noticed some memory issue for a few years, slowly progressive. She loses things, she has to write everything down. She says she lives alone, her husband died a year ago. She was married for 50 years. She is a former Engineer, maintenance (IT) so the bills are taken care of, she has had to learn about machines and maintenance since her husband died. She was late once on a bill but denies any other issues with bills. She doesn't do much cooking, husband used to do that. She just heats things up now and uses the microwave. She left a tea kettle on twice, all the water burned away and she had to throw the kettle away. I aked for her son's address and phone and she says she can't remember will get it to us(doesn't know it off hand), he lives in [place] and she speaks and sees him occassionally. A friend and her spent a few days touring the retirment communities and she prefers to live at home in her house. She has some neighbors and a few friends, her close friends have all died or are far away. She reads the newspaper, she does al the crossword puzzles. She has a schizophrenic son who visits every day. Denies any falls. She feels her memory loss is commensurate with her age. She denies any dementia or significant memory issues. She feels she does fine and she will live in her home and will not go to a retirement community. No other accidents in the home. No other neurologic complaints or symptoms.  Reviewed notes, labs and imaging from outside physicians, which showed: Reviewed notes from pcp. She is having short-term memory problems,  lives alone, closest relative in [place], has left the tea kettle on x2 and repeats questions. Per Dr. Osborne Casco, during discussion of memory patient repeated herself quite a lot, asked the same questions, admitted about the teapot, has a son in [place] that calls once a month and sees him a few times a year, she is determined to continue living in her home, neighbors check in on her. Exam including ent, neck, lungs, CV, GI, were nml. Had some dec rom in the pelvis,spine, she has chronic back pain.  Review of Systems: Patient complains of symptoms per HPI as well as the following symptoms: fatigue, anxiety, hearing loss, constopation, memory loss, confusion, weakness, anxiety. Pertinent negatives per HPI. All others negative.   Social History   Social History  . Marital Status: Widowed    Spouse Name: Deidre Ala  . Number of Children: 4  . Years of Education: 16   Occupational History  . Retired    Social History Main Topics  . Smoking status: Former Smoker    Quit date: 05/17/1946  . Smokeless tobacco: Never Used  . Alcohol Use: 0.0 oz/week    0 Standard drinks or equivalent per week  . Drug Use: No  . Sexual Activity: No   Other Topics Concern  . Not on file   Social History Narrative   Lives alone   Caffeine use: 2 drinks per day (hot tea)    Family History  Problem Relation Age of Onset  . Melanoma Mother   . Schizophrenia Son   . Heart attack Maternal Grandfather   . Anxiety disorder    . Dementia Neg Hx     Past Medical History  Diagnosis Date  . Hyperlipemia   . Spinal stenosis   . Cluster headaches   . Kidney stones 1970  . Osteopenia   . Allergic rhinitis   . Chronic kidney disease (CKD), stage III (moderate)   . Diverticulosis   . SCC (squamous cell carcinoma) 4/11    of face and leg  . Osteoarthritis   . Hypertension   . Breast cyst 9/94  . Hematuria     evaluation showed stones but still with hematuria  . Anxiety     Past Surgical History    Procedure Laterality Date  . Cholecystectomy  1977  . Breast biopsy  5/12    stromal fibrosis and microcalcifications  . Appendectomy    . Skin biopsy  4/11     squamous cell ca  . Colonoscopy  10/24/2012    Current Outpatient Prescriptions  Medication Sig Dispense Refill  . aspirin 81 MG tablet Take 81 mg by mouth daily.    . Calcium Carbonate-Vitamin D (CALCIUM-D PO) Take 600 mg by mouth 2 (two) times daily.    Marland Kitchen etodolac (LODINE) 400 MG tablet Take 400 mg by mouth daily.    . hydrochlorothiazide (HYDRODIURIL) 25 MG tablet Take 25 mg by mouth daily.    Marland Kitchen HYDROcodone-acetaminophen (NORCO/VICODIN) 5-325 MG tablet Take 1 tablet by mouth 2 (two) times daily.  0  . loratadine (CLARITIN) 10 MG tablet Take 10 mg by mouth daily.    . mirtazapine (REMERON) 7.5 MG tablet Take 7.5 mg by mouth daily.  0  . donepezil (ARICEPT) 10 MG tablet Take 1 tablet (10 mg total) by mouth at bedtime. 30 tablet 12   No current facility-administered medications for this visit.    Allergies as of 11/04/2015  . (No Known Allergies)    Vitals: BP 120/58 mmHg  Pulse 89  Ht 5\' 6"  (1.676 m)  Wt 126 lb (57.153 kg)  BMI 20.35 kg/m2  SpO2 96%  LMP 05/17/1988 Last Weight:  Wt Readings from Last 1 Encounters:  11/04/15 126 lb (57.153 kg)   Last Height:   Ht Readings from Last 1 Encounters:  11/04/15 5\' 6"  (1.676 m)   Physical exam: Exam: Gen: NAD, conversant, well nourised, thin, well groomed                     CV: RRR, no MRG. No Carotid Bruits. No peripheral edema, warm, nontender Eyes: Conjunctivae clear without exudates or hemorrhage  Neuro: Detailed Neurologic Exam  Speech:    Speech is normal; fluent and spontaneous with normal comprehension.  Cognition:    The patient is oriented to person, place, and time;     recent and remote memory intact;     language fluent;     normal attention, concentration,     fund of knowledge  MMSE - Mini Mental State Exam 11/04/2015  Orientation to  time 4  Orientation to Place 5  Registration 3  Attention/ Calculation 5  Recall 1  Language- name 2 objects 2  Language- repeat 1  Language- follow 3 step command 3  Language- read & follow direction 1  Write a sentence 1  Copy design 1  Total score 27   Cranial Nerves:    The pupils are  equal, round, and reactive to light. The fundi are normal and spontaneous venous pulsations are present. Visual fields are full to finger confrontation. Extraocular movements are intact. Trigeminal sensation is intact and the muscles of mastication are normal. The face is symmetric. The palate elevates in the midline. Hearing intact. Voice is normal. Shoulder shrug is normal. The tongue has normal motion without fasciculations.   Coordination:    Normal finger to nose and heel to shin. Normal rapid alternating movements.   Gait:    Slightly atooped, slowed but good stride.   Motor Observation:    No asymmetry, no atrophy, and no involuntary movements noted. Tone:    Normal muscle tone.    Posture:    Posture is normal. normal erect    Strength:    Strength is V/V in the upper and lower limbs.      Sensation: intact to LT     Reflex Exam:  DTR's:    Deep tendon reflexes in the upper and lower extremities are normal bilaterally.   Toes:    The toes are downgoing bilaterally.   Clonus:    Clonus is absent.       Assessment/Plan:  lovely 82 y.o. female here as a referral from Dr. Osborne Casco for short-term memory changes. PMHx HLD, spinal stenosis, cluster ha, kidney stones, CKD, memory loss, HTN, DDD lumbar. Hld. She has noticed some memory issue for a few years, slowly progressive. There are concerning issues, patient appears to have cognitive difficulties and lives alone at home. She has forgotten about the T-Kettle on the stove several times. Her Mini-Mental Status exam was 27 out of 30 which is somewhat reassuring.  She is determined to live in her home and will not consider retirement  community. She states she has a Schizophrenic son that comes to see her every day, but her other son is in [place] and doesn't visit often.She is open to a workup.we'll check B12 and thyroid, MRI of the brain without contrast. She is also accepting of starting Aricept which is wonderful.we'll complete and follow her closely in 4 months. MRI of the brain was completed in 2010, that report is not in EPIC however will try to request a CD of images and the report if still in existence.   Addedndum: B12 low-normal, 270. Asked her to take B12 orally and recheck at next appt.  Sarina Ill, MD  [facility] [address] [place], [place] [address]  Phone [phone] Fax [phone]

## 2015-11-04 NOTE — Telephone Encounter (Signed)
Patient is calling with questions in her appointment notes.  She needed to know what the B12 and THS meant. (I answered several general questions for her but she seemed confused).  She asked for a call back.

## 2015-11-05 LAB — B12 AND FOLATE PANEL
Folate: 17.3 ng/mL (ref >3.0)
VITAMIN B 12: 270 pg/mL (ref 211–946)

## 2015-11-05 LAB — TSH: TSH: 3.04 u[IU]/mL (ref 0.450–4.500)

## 2015-11-06 ENCOUNTER — Telehealth: Payer: Self-pay | Admitting: *Deleted

## 2015-11-06 NOTE — Telephone Encounter (Signed)
Called and spoke to Alicia Harrison about lab results per Dr Jaynee Eagles note. Reminded her of appt on 12/19 at 11am. She wrote down instructions for what she needs to Vit B12 and read it back to me.

## 2015-11-06 NOTE — Telephone Encounter (Signed)
-----   Message from Melvenia Beam, MD sent at 11/05/2015  6:20 PM EDT ----- Her B12 is technically normal but on the lower end. I think she should be taking B12 supplementation.  B12 deficiency can cause weakness, fatigue, easy bruising or bleeding,sore tongue, stomach upset, weight loss, and diarrhea or constipation, tingling or numbness to the fingers and toes, difficulty walking, mood changes, depression, memory loss, disorientation and, in severe cases, dementia. Recommend 1000-2000 mcg B12 daily. recheck B12 when she comes in next time. Thanks.

## 2015-11-13 ENCOUNTER — Ambulatory Visit
Admission: RE | Admit: 2015-11-13 | Discharge: 2015-11-13 | Disposition: A | Discharge status: Home / Self Care | Payer: Medicare Other | Source: Ambulatory Visit | Attending: Neurology | Admitting: Neurology

## 2015-11-13 DIAGNOSIS — F05 Delirium due to known physiological condition: Secondary | ICD-10-CM

## 2015-11-13 DIAGNOSIS — R413 Other amnesia: Secondary | ICD-10-CM

## 2015-11-13 DIAGNOSIS — E538 Deficiency of other specified B group vitamins: Secondary | ICD-10-CM

## 2015-11-13 DIAGNOSIS — R410 Disorientation, unspecified: Secondary | ICD-10-CM

## 2015-11-13 DIAGNOSIS — R251 Tremor, unspecified: Secondary | ICD-10-CM | POA: Diagnosis not present

## 2015-11-20 ENCOUNTER — Telehealth: Payer: Self-pay | Admitting: *Deleted

## 2015-11-20 NOTE — Telephone Encounter (Signed)
LVM requesting call back re: MRI brain results. Left name, number.

## 2015-11-26 ENCOUNTER — Telehealth: Payer: Self-pay | Admitting: *Deleted

## 2015-11-26 NOTE — Telephone Encounter (Signed)
-----   Message from Melvenia Beam, MD sent at 11/19/2015  6:39 PM EDT ----- There haven't been many changes in her MRI since 2010. Pretty stable. However there is atrophy which can be seen with aging or memory loss and she has white matter changes. No strokes. Good news is that it is stable since 2010, nothing new. We can discuss at next appointment and I can show it to her thanks.

## 2015-11-26 NOTE — Telephone Encounter (Signed)
Called and spoke to pt about MRI results per Dr Jaynee Eagles note. She verbalized understanding and wrote down results and read back to me to make sure she understood. I reminded her of appt w. MM, NP on 12/19 at 11am. She verbalized understanding. She declined wanting to review images. She stated there was no need at this time and would keep f/u with MM, NP. I offered to r.s to Dr Jaynee Eagles around same time if she did want to review images at that time.

## 2016-01-12 ENCOUNTER — Encounter: Payer: Self-pay | Admitting: Adult Health

## 2016-03-06 ENCOUNTER — Emergency Department (HOSPITAL_COMMUNITY)
Admission: EM | Admit: 2016-03-06 | Discharge: 2016-03-06 | Disposition: A | Discharge status: Home / Self Care | Payer: Medicare Other | Attending: Emergency Medicine | Admitting: Emergency Medicine

## 2016-03-06 ENCOUNTER — Emergency Department (HOSPITAL_COMMUNITY): Payer: Medicare Other

## 2016-03-06 ENCOUNTER — Encounter (HOSPITAL_COMMUNITY): Payer: Self-pay | Admitting: *Deleted

## 2016-03-06 DIAGNOSIS — R0789 Other chest pain: Secondary | ICD-10-CM | POA: Insufficient documentation

## 2016-03-06 DIAGNOSIS — N183 Chronic kidney disease, stage 3 (moderate): Secondary | ICD-10-CM | POA: Diagnosis not present

## 2016-03-06 DIAGNOSIS — Z87891 Personal history of nicotine dependence: Secondary | ICD-10-CM | POA: Insufficient documentation

## 2016-03-06 DIAGNOSIS — R079 Chest pain, unspecified: Secondary | ICD-10-CM | POA: Diagnosis present

## 2016-03-06 DIAGNOSIS — Z79899 Other long term (current) drug therapy: Secondary | ICD-10-CM | POA: Insufficient documentation

## 2016-03-06 DIAGNOSIS — Z7982 Long term (current) use of aspirin: Secondary | ICD-10-CM | POA: Insufficient documentation

## 2016-03-06 DIAGNOSIS — I129 Hypertensive chronic kidney disease with stage 1 through stage 4 chronic kidney disease, or unspecified chronic kidney disease: Secondary | ICD-10-CM | POA: Diagnosis not present

## 2016-03-06 LAB — BASIC METABOLIC PANEL
ANION GAP: 11 (ref 5–15)
BUN: 25 mg/dL — ABNORMAL HIGH (ref 6–20)
CALCIUM: 10.1 mg/dL (ref 8.9–10.3)
CO2: 29 mmol/L (ref 22–32)
Chloride: 100 mmol/L — ABNORMAL LOW (ref 101–111)
Creatinine, Ser: 1.21 mg/dL — ABNORMAL HIGH (ref 0.44–1.00)
GFR, EST AFRICAN AMERICAN: 45 mL/min — AB (ref >60)
GFR, EST NON AFRICAN AMERICAN: 39 mL/min — AB (ref >60)
Glucose, Bld: 104 mg/dL — ABNORMAL HIGH (ref 65–99)
POTASSIUM: 3.8 mmol/L (ref 3.5–5.1)
Sodium: 140 mmol/L (ref 135–145)

## 2016-03-06 LAB — CBC
HCT: 37.6 % (ref 36.0–46.0)
HEMOGLOBIN: 12.3 g/dL (ref 12.0–15.0)
MCH: 31.1 pg (ref 26.0–34.0)
MCHC: 32.7 g/dL (ref 30.0–36.0)
MCV: 95.2 fL (ref 78.0–100.0)
Platelets: 250 10*3/uL (ref 150–400)
RBC: 3.95 MIL/uL (ref 3.87–5.11)
RDW: 13.2 % (ref 11.5–15.5)
WBC: 7.5 10*3/uL (ref 4.0–10.5)

## 2016-03-06 LAB — I-STAT TROPONIN, ED: TROPONIN I, POC: 0 ng/mL (ref 0.00–0.08)

## 2016-03-06 NOTE — ED Provider Notes (Signed)
[facility] DEPT Provider Note   CSN: [id number] Arrival date & time: 03/06/16  1628     History   Chief Complaint Chief Complaint  Patient presents with  . Chest Pain    HPI Alicia Harrison is a 82 y.o. female.  Patient is a 82 year old female who presents with chest pain. She states she had an episode of chest pain earlier today around 11:00. She can't really describe it but says that it was a brief pain that went across her chest. She states it lasted just a few seconds. She does remember having any associated symptoms. She feels like she may have been a little short of breath at the time but denies any other associated symptoms. To me she denies any dizziness, lightheadedness or nausea. She's had no further symptoms since that time. She states that she does physical therapy 3 times a week on all the different machines and doesn't have any trouble completing these tasks. She denies any leg pain or swelling. She denies any pleuritic-type symptoms. She had a similar episode of chest pain about 3 weeks ago that was the same as today's episode. It lasted just a few seconds as well. She's had no ongoing symptoms. No history of similar pain in the past. No cough or chest congestion.    Chest Pain   Pertinent negatives include no abdominal pain, no back pain, no cough, no diaphoresis, no dizziness, no fever, no headaches, no nausea, no numbness, no shortness of breath, no vomiting and no weakness.    Past Medical History:  Diagnosis Date  . Allergic rhinitis   . Anxiety   . Breast cyst 9/94  . Chronic kidney disease (CKD), stage III (moderate)   . Cluster headaches   . Diverticulosis   . Hematuria    evaluation showed stones but still with hematuria  . Hyperlipemia   . Hypertension   . Kidney stones 1970  . Osteoarthritis   . Osteopenia   . SCC (squamous cell carcinoma) 4/11   of face and leg  . Spinal stenosis     Patient Active Problem List   Diagnosis Date Noted    . Cognitive impairment 11/04/2015  . Chronic back pain 11/04/2015  . CKD (chronic kidney disease) 11/04/2015  . HTN (hypertension) 11/04/2015  . Blood in stool 09/08/2011    Past Surgical History:  Procedure Laterality Date  . APPENDECTOMY    . BREAST BIOPSY  5/12   stromal fibrosis and microcalcifications  . CHOLECYSTECTOMY  1977  . COLONOSCOPY  10/24/2012  . SKIN BIOPSY  4/11    squamous cell ca    OB History    Gravida Para Term Preterm AB Living   4 4       4    SAB TAB Ectopic Multiple Live Births                   Home Medications    Prior to Admission medications   Medication Sig Start Date End Date Taking? Authorizing Provider  aspirin 81 MG tablet Take 81 mg by mouth daily.    Historical Provider, MD  Calcium Carbonate-Vitamin D (CALCIUM-D PO) Take 600 mg by mouth 2 (two) times daily.    Historical Provider, MD  donepezil (ARICEPT) 10 MG tablet Take 1 tablet (10 mg total) by mouth at bedtime. 11/04/15   Melvenia Beam, MD  etodolac (LODINE) 400 MG tablet Take 400 mg by mouth daily.    Historical Provider, MD  hydrochlorothiazide (HYDRODIURIL)  25 MG tablet Take 25 mg by mouth daily.    Historical Provider, MD  HYDROcodone-acetaminophen (NORCO/VICODIN) 5-325 MG tablet Take 1 tablet by mouth 2 (two) times daily. 10/15/15   Historical Provider, MD  loratadine (CLARITIN) 10 MG tablet Take 10 mg by mouth daily.    Historical Provider, MD  mirtazapine (REMERON) 7.5 MG tablet Take 7.5 mg by mouth daily. 10/14/15   Historical Provider, MD    Family History Family History  Problem Relation Age of Onset  . Melanoma Mother   . Schizophrenia Son   . Heart attack Maternal Grandfather   . Anxiety disorder    . Dementia Neg Hx     Social History Social History  Substance Use Topics  . Smoking status: Former Smoker    Quit date: 05/17/1946  . Smokeless tobacco: Never Used  . Alcohol use 0.0 oz/week     Allergies   Review of patient's allergies indicates no known  allergies.   Review of Systems Review of Systems  Constitutional: Negative for chills, diaphoresis, fatigue and fever.  HENT: Negative for congestion, rhinorrhea and sneezing.   Eyes: Negative.   Respiratory: Negative for cough, chest tightness and shortness of breath.   Cardiovascular: Positive for chest pain. Negative for leg swelling.  Gastrointestinal: Negative for abdominal pain, blood in stool, diarrhea, nausea and vomiting.  Genitourinary: Negative for difficulty urinating, flank pain, frequency and hematuria.  Musculoskeletal: Negative for arthralgias and back pain.  Skin: Negative for rash.  Neurological: Negative for dizziness, speech difficulty, weakness, numbness and headaches.     Physical Exam Updated Vital Signs BP 141/70 (BP Location: Right Arm)   Pulse 96   Temp 97.6 F (36.4 C) (Oral)   Resp 16   Ht 5\' 6"  (1.676 m)   Wt 120 lb (54.4 kg)   LMP 05/17/1988   SpO2 97%   BMI 19.37 kg/m   Physical Exam  Constitutional: She is oriented to person, place, and time. She appears well-developed and well-nourished.  HENT:  Head: Normocephalic and atraumatic.  Eyes: Pupils are equal, round, and reactive to light.  Neck: Normal range of motion. Neck supple.  Cardiovascular: Normal rate, regular rhythm and normal heart sounds.   Pulmonary/Chest: Effort normal and breath sounds normal. No respiratory distress. She has no wheezes. She has no rales. She exhibits no tenderness.  Abdominal: Soft. Bowel sounds are normal. There is no tenderness. There is no rebound and no guarding.  Musculoskeletal: Normal range of motion. She exhibits no edema.  No edema or calf tenderness  Lymphadenopathy:    She has no cervical adenopathy.  Neurological: She is alert and oriented to person, place, and time.  Skin: Skin is warm and dry. No rash noted.  Psychiatric: She has a normal mood and affect.     ED Treatments / Results  Labs (all labs ordered are listed, but only abnormal  results are displayed) Labs Reviewed  BASIC METABOLIC PANEL - Abnormal; Notable for the following:       Result Value   Chloride 100 (*)    Glucose, Bld 104 (*)    BUN 25 (*)    Creatinine, Ser 1.21 (*)    GFR calc non Af Amer 39 (*)    GFR calc Af Amer 45 (*)    All other components within normal limits  CBC  I-STAT TROPOININ, ED    EKG  EKG Interpretation  Date/Time:  Saturday March 06 2016 16:42:15 EDT Ventricular Rate:  92 PR Interval:  QRS Duration: 88 QT Interval:  375 QTC Calculation: 464 R Axis:   -14 Text Interpretation:  Sinus rhythm Low voltage, extremity leads Baseline wander in lead(s) V2 No old tracing to compare Confirmed by Crystle Carelli  MD, Opal Mckellips [phone]) on 03/06/2016 5:57:55 PM       Radiology Dg Chest 2 View  Result Date: 03/06/2016 CLINICAL DATA:  Chest pain EXAM: CHEST  2 VIEW COMPARISON:  None. FINDINGS: Normal heart size. Minimally tortuous thoracic aorta. Otherwise normal mediastinal contour. No pneumothorax. No pleural effusion. Lungs appear clear, with no acute consolidative airspace disease and no pulmonary edema. IMPRESSION: No active cardiopulmonary disease. Electronically Signed   By: Ilona Sorrel M.D.   On: 03/06/2016 17:18    Procedures Procedures (including critical care time)  Medications Ordered in ED Medications - No data to display   Initial Impression / Assessment and Plan / ED Course  I have reviewed the triage vital signs and the nursing notes.  Pertinent labs & imaging results that were available during my care of the patient were reviewed by me and considered in my medical decision making (see chart for details).  Clinical Course    Patient had one brief episode of chest pain today lasting just a few seconds. She's had no further symptoms. No other associated symptoms. Her EKG does not demonstrate ischemic changes. Her troponin is negative. I don't feel that she needs a repeat troponin given that her symptoms were this  morning, greater than 6 hours ago. Her chest x-ray is clear without evidence of pneumonia. She was discharged home in good condition. She was encouraged to have close follow-up with her PCP for reevaluation and return here as needed if she has any ongoing or worsening symptoms.  Final Clinical Impressions(s) / ED Diagnoses   Final diagnoses:  Atypical chest pain    New Prescriptions New Prescriptions   No medications on file     Malvin Johns, MD 03/06/16 [id number]

## 2016-03-06 NOTE — ED Triage Notes (Signed)
Patient called her PCP on call today to tell him of pain in her chest and was directed to ED.  Patient states pain lasted 10 seconds.  She states she had a similar pain a week ago or so.  Patient states both times the pains occurred, she was doing "absoluately nothing" and was sitting at a table reviewing documents.  Patient states pain was accompanied by a brief episode of dizziness and she felt "questy."  Patient states she also feels as though to breathe she must take deep breaths.  Patient denies dizziness and pain currently.

## 2016-05-04 ENCOUNTER — Encounter: Payer: Self-pay | Admitting: Adult Health

## 2016-05-04 ENCOUNTER — Ambulatory Visit: Payer: Medicare Other | Admitting: Adult Health

## 2016-05-04 ENCOUNTER — Ambulatory Visit (INDEPENDENT_AMBULATORY_CARE_PROVIDER_SITE_OTHER): Payer: Medicare Other | Admitting: Adult Health

## 2016-05-04 VITALS — BP 95/60 | Temp 95.0°F | Wt 115.5 lb

## 2016-05-04 DIAGNOSIS — R413 Other amnesia: Secondary | ICD-10-CM | POA: Diagnosis not present

## 2016-05-04 NOTE — Patient Instructions (Signed)
Continue Aricept- move to the morning to see if vivid dreams stop Continue Vit B12 supplement Memory score is stable If your symptoms worsen or you develop new symptoms please let us know.

## 2016-05-04 NOTE — Progress Notes (Signed)
PATIENT: Alicia Harrison DOB: [birth year]  REASON FOR VISIT: follow up - Warren Lacy disturbance HISTORY FROM: patient  HISTORY OF PRESENT ILLNESS: Today 05/04/2016 Alicia Harrison is an 82 year old female with a history of memory disturbance. She returns today for follow-up. She is currently taking Aricept 10 mg at bedtime and tolerating it well. Denies any GI upset. She continues to live at home alone. She is able to complete all ADLs independently. She operates a Teacher, music without difficulty. She continues to prepare her own meals. She says there has been occasion where she has left the kettle on the stove and the water has boiled dry. She states that she sleeps okay occasionally she is restless. She does take pain medication at bedtime for spinal stenosis. She reports that she was recently referred for physical therapy for stenosis. Denies hallucinations. She denies vivid dreams although she reports that she had one occasion where she dreamed that she bought her son a present and the next day she thought the dream was real and called the department store but then realized that she dreamed this. She does notice some weight loss. Unsure if this is related to Aricept or just forgetting to eat. Returns today for an evaluation.  HISTORY 11/04/15: Alicia Harrison is a lovely 82 y.o. female here as a referral from Dr. Osborne Casco for short-term memory changes. PMHx HLD, spinal stenosis, cluster ha, kidney stones, CKD, memory loss, HTN, DDD lumbar. Hld. She has noticed some memory issue for a few years, slowly progressive. She loses things, she has to write everything down. She says she lives alone, her husband died a year ago. She was married for 50 years. She is a former Engineer, maintenance (IT) so the bills are taken care of, she has had to learn about machines and maintenance since her husband died. She was late once on a bill but denies any other issues with bills. She doesn't do much cooking, husband used to do that. She just  heats things up now and uses the microwave. She left a tea kettle on twice, all the water burned away and she had to throw the kettle away. I aked for her son's address and phone and she says she can't remember will get it to us(doesn't know it off hand), he lives in [place] and she speaks and sees him occassionally. A friend and her spent a few days touring the retirment communities and she prefers to live at home in her house. She has some neighbors and a few friends, her close friends have all died or are far away. She reads the newspaper, she does al the crossword puzzles. She has a schizophrenic son who visits every day. Denies any falls. She feels her memory loss is commensurate with her age. She denies any dementia or significant memory issues. She feels she does fine and she will live in her home and will not go to a retirement community. No other accidents in the home. No other neurologic complaints or symptoms.  Reviewed notes, labs and imaging from outside physicians, which showed: Reviewed notes from pcp. She is having short-term memory problems, lives alone, closest relative in [place], has left the tea kettle on x2 and repeats questions. Per Dr. Osborne Casco, during discussion of memory patient repeated herself quite a lot, asked the same questions, admitted about the teapot, has a son in [place] that calls once a month and sees him a few times a year, she is determined to continue living in her home, neighbors  check in on her. Exam including ent, neck, lungs, CV, GI, were nml. Had some dec rom in the pelvis,spine, she has chronic back pain  REVIEW OF SYSTEMS: Out of a complete 14 system review of symptoms, the patient complains only of the following symptoms, and all other reviewed systems are negative.  Frequency of urination, back pain, wounds, confusion, depression, nervous/anxious, memory loss, environmental allergies, cold intolerance, constipation, frequent waking, acting out dreams,  runny nose  ALLERGIES: No Known Allergies  HOME MEDICATIONS: Outpatient Medications Prior to Visit  Medication Sig Dispense Refill  . aspirin 81 MG tablet Take 81 mg by mouth daily.    . Calcium Carbonate-Vitamin D (CALCIUM-D PO) Take 600 mg by mouth 2 (two) times daily.    Marland Kitchen donepezil (ARICEPT) 10 MG tablet Take 1 tablet (10 mg total) by mouth at bedtime. 30 tablet 12  . etodolac (LODINE) 400 MG tablet Take 400 mg by mouth daily.    . hydrochlorothiazide (HYDRODIURIL) 25 MG tablet Take 25 mg by mouth daily.    Marland Kitchen HYDROcodone-acetaminophen (NORCO/VICODIN) 5-325 MG tablet Take 1 tablet by mouth 2 (two) times daily.  0  . loratadine (CLARITIN) 10 MG tablet Take 10 mg by mouth daily.    . mirtazapine (REMERON) 7.5 MG tablet Take 7.5 mg by mouth daily.  0   No facility-administered medications prior to visit.     PAST MEDICAL HISTORY: Past Medical History:  Diagnosis Date  . Allergic rhinitis   . Anxiety   . Breast cyst 9/94  . Chronic kidney disease (CKD), stage III (moderate)   . Cluster headaches   . Diverticulosis   . Hematuria    evaluation showed stones but still with hematuria  . Hyperlipemia   . Hypertension   . Kidney stones 1970  . Memory loss   . Osteoarthritis   . Osteopenia   . SCC (squamous cell carcinoma) 4/11   of face and leg  . Spinal stenosis     PAST SURGICAL HISTORY: Past Surgical History:  Procedure Laterality Date  . APPENDECTOMY    . BREAST BIOPSY  5/12   stromal fibrosis and microcalcifications  . CHOLECYSTECTOMY  1977  . COLONOSCOPY  10/24/2012  . SKIN BIOPSY  4/11    squamous cell ca    FAMILY HISTORY: Family History  Problem Relation Age of Onset  . Melanoma Mother   . Schizophrenia Son   . Heart attack Maternal Grandfather   . Anxiety disorder    . Dementia Neg Hx     SOCIAL HISTORY: Social History   Social History  . Marital status: Widowed    Spouse name: Deidre Ala  . Number of children: 4  . Years of education: 16    Occupational History  . Retired    Social History Main Topics  . Smoking status: Former Smoker    Quit date: 05/17/1946  . Smokeless tobacco: Never Used  . Alcohol use 0.6 oz/week    1 Glasses of wine per week     Comment: occasionally/social  . Drug use: No  . Sexual activity: No   Other Topics Concern  . Not on file   Social History Narrative   Lives alone   Caffeine use: 2 drinks per day (hot tea)      PHYSICAL EXAM  Vitals:   05/04/16 1108  BP: 95/60  Temp: (!) 95 F (35 C)  Weight: 115 lb 8 oz (52.4 kg)   Body mass index is 18.64 kg/m. MMSE - Mini  Mental State Exam 05/04/2016 11/04/2015  Orientation to time 4 4  Orientation to Place 5 5  Registration 3 3  Attention/ Calculation 5 5  Recall 2 1  Language- name 2 objects 2 2  Language- repeat 1 1  Language- follow 3 step command 3 3  Language- read & follow direction 1 1  Write a sentence 1 1  Copy design 1 1  Total score 28 27    Generalized: Well developed, in no acute distress   Neurological examination  Mentation: Alert oriented to time, place, history taking. Follows all commands speech and language fluent Cranial nerve II-XII: Pupils were equal round reactive to light. Extraocular movements were full, visual field were full on confrontational test. Facial sensation and strength were normal. Uvula tongue midline. Head turning and shoulder shrug  were normal and symmetric. Motor: The motor testing reveals 5 over 5 strength of all 4 extremities. Good symmetric motor tone is noted throughout.  Sensory: Sensory testing is intact to soft touch on all 4 extremities. No evidence of extinction is noted.  Coordination: Cerebellar testing reveals good finger-nose-finger and heel-to-shin bilaterally.  Gait and station: Gait is normal. Tandem gait is unsteady. Romberg is negative. No drift is seen.  Reflexes: Deep tendon reflexes are symmetric and normal bilaterally.   DIAGNOSTIC DATA (LABS, IMAGING,  TESTING) - I reviewed patient records, labs, notes, testing and imaging myself where available.  Lab Results  Component Value Date   WBC 7.5 03/06/2016   HGB 12.3 03/06/2016   HCT 37.6 03/06/2016   MCV 95.2 03/06/2016   PLT 250 03/06/2016      Component Value Date/Time   NA 140 03/06/2016 1735   K 3.8 03/06/2016 1735   CL 100 (L) 03/06/2016 1735   CO2 29 03/06/2016 1735   GLUCOSE 104 (H) 03/06/2016 1735   BUN 25 (H) 03/06/2016 1735   CREATININE 1.21 (H) 03/06/2016 1735   CALCIUM 10.1 03/06/2016 1735   PROT 6.5 12/28/2007 1758   ALBUMIN 3.4 (L) 12/28/2007 1758   AST 33 12/28/2007 1758   ALT 32 12/28/2007 1758   ALKPHOS 52 12/28/2007 1758   BILITOT 0.8 12/28/2007 1758   GFRNONAA 39 (L) 03/06/2016 1735   GFRAA 45 (L) 03/06/2016 1735    Lab Results  Component Value Date   VITAMINB12 270 11/04/2015   Lab Results  Component Value Date   TSH 3.040 11/04/2015      ASSESSMENT AND PLAN 82 y.o. year old female  has a past medical history of Allergic rhinitis; Anxiety; Breast cyst (9/94); Chronic kidney disease (CKD), stage III (moderate); Cluster headaches; Diverticulosis; Hematuria; Hyperlipemia; Hypertension; Kidney stones (1970); Memory loss; Osteoarthritis; Osteopenia; SCC (squamous cell carcinoma) (4/11); and Spinal stenosis. here with:  1. Memory disturbance  The patient will continue on Aricept 10 mg at bedtime. We will need to monitor her weight. If she continues to have weight loss donzepil will need to be decreased or discontinued. I will call the patient in 1 month to check her weight. Patient also advised that if she has vivid dreams she can move Aricept to the morning. Her memory score has remained stable. Advised that if her symptoms worsen or she develops any new symptoms she should let us know. Follow-up in 6 months or sooner if needed.    Ward Givens, MSN, NP-C 05/04/2016, 11:36 AM [facility] [address], [place],   [id number] [phone]

## 2016-05-08 NOTE — Progress Notes (Signed)
Personally have participated in and made any corrections needed to history, physical, neuro exam,assessment and plan as stated above.  I have personally reviewed the history, evaluated lab date, reviewed imaging studies and agree with radiology interpretations.    Antonia Ahern, MD  

## 2016-07-01 ENCOUNTER — Other Ambulatory Visit: Payer: Self-pay | Admitting: *Deleted

## 2016-07-01 ENCOUNTER — Telehealth: Payer: Self-pay | Admitting: Adult Health

## 2016-07-01 DIAGNOSIS — F05 Delirium due to known physiological condition: Secondary | ICD-10-CM

## 2016-07-01 DIAGNOSIS — R251 Tremor, unspecified: Secondary | ICD-10-CM

## 2016-07-01 DIAGNOSIS — R410 Disorientation, unspecified: Secondary | ICD-10-CM

## 2016-07-01 DIAGNOSIS — E538 Deficiency of other specified B group vitamins: Secondary | ICD-10-CM

## 2016-07-01 DIAGNOSIS — R413 Other amnesia: Secondary | ICD-10-CM

## 2016-07-01 MED ORDER — DONEPEZIL HCL 10 MG PO TABS: 10.0000 mg | ORAL_TABLET | Freq: Every day | ORAL | 4 refills | Status: DC

## 2016-07-01 NOTE — Telephone Encounter (Signed)
Done.  Damaris Schooner to [name], son and confirmed next appt.

## 2016-07-01 NOTE — Telephone Encounter (Signed)
Patients son [name] called office in reference to donepezil (ARICEPT) 10 MG tablet.  Wanting to switch this medication to [facility] instead of current pharmacy.  Please call

## 2016-10-18 ENCOUNTER — Telehealth: Payer: Self-pay | Admitting: Adult Health

## 2016-10-18 NOTE — Telephone Encounter (Signed)
Pt said she is out ofdonepezil (ARICEPT) 10 MG tablet. She said [facility] sends it. I gave her [facility] phone #, she will call. She will call back if she has any questions

## 2016-10-18 NOTE — Telephone Encounter (Signed)
Noted.  Looks like she has yearly prescription from 06/2016, that was sent to [facility].

## 2016-11-01 ENCOUNTER — Ambulatory Visit (INDEPENDENT_AMBULATORY_CARE_PROVIDER_SITE_OTHER): Payer: Medicare Other | Admitting: Adult Health

## 2016-11-01 ENCOUNTER — Encounter: Payer: Self-pay | Admitting: Adult Health

## 2016-11-01 VITALS — BP 129/77 | HR 101 | Wt 111.6 lb

## 2016-11-01 DIAGNOSIS — R413 Other amnesia: Secondary | ICD-10-CM | POA: Diagnosis not present

## 2016-11-01 MED ORDER — MEMANTINE HCL 5 MG PO TABS: 5.0000 mg | ORAL_TABLET | Freq: Two times a day (BID) | ORAL | 5 refills | Status: DC

## 2016-11-01 NOTE — Progress Notes (Signed)
PATIENT: Alicia Harrison DOB: 05/08/1926  REASON FOR VISIT: follow up-memory disturbance HISTORY FROM: patient  HISTORY OF PRESENT ILLNESS: Alicia Harrison is an 83 year old female with a history of memory disturbance. She returns today for follow-up. She is currently taking Aricept 10 mg at bedtime. She reports that she is tolerating this well. She lives at home alone. She states that she is no longer operating a motor vehicle. She is able to complete all ADLs independently. She prepares her own meals. Denies any trouble sleeping. Denies hallucinations. Patient son reports that she's been depressed.. Her primary care increase her Remeron. She started the increased dose yesterday. Son notices that she repeats conversations. She is now only using tramadol for pain management. She returns today for an evaluation.  HISTORY Today 05/04/2016 Alicia Harrison is an 83 year old female with a history of memory disturbance. She returns today for follow-up. She is currently taking Aricept 10 mg at bedtime and tolerating it well. Denies any GI upset. She continues to live at home alone. She is able to complete all ADLs independently. She operates a Teacher, music without difficulty. She continues to prepare her own meals. She says there has been occasion where she has left the kettle on the stove and the water has boiled dry. She states that she sleeps okay occasionally she is restless. She does take pain medication at bedtime for spinal stenosis. She reports that she was recently referred for physical therapy for stenosis. Denies hallucinations. She denies vivid dreams although she reports that she had one occasion where she dreamed that she bought her son a present and the next day she thought the dream was real and called the department store but then realized that she dreamed this. She does notice some weight loss. Unsure if this is related to Aricept or just forgetting to eat. Returns today for an  evaluation.  HISTORY 11/04/15: Alicia Harrison a lovely 83 y.o.femalehere as a referral from Dr. Lenor Derrick short-term memory changes. PMHx HLD, spinal stenosis, cluster ha, kidney stones, CKD, memory loss, HTN, DDD lumbar. Hld. She has noticed some memory issue for a few years, slowly progressive. She loses things, she has to write everything down. She says she lives alone, her husband died a year ago. She was married for 50 years. She is a former Engineer, maintenance (IT) so the bills are taken care of, she has had to learn about machines and maintenance since her husband died. She was late once on a bill but denies any other issues with bills. She doesn't do much cooking, husband used to do that. She just heats things up now and uses the microwave. She left a tea kettle on twice, all the water burned away and she had to throw the kettle away. I aked for her son's address and phone and she says she can't remember will get it to us(doesn't know it off hand), he lives in [place] and she speaks and sees him occassionally. A friend and her spent a few days touring the retirment communities and she prefers to live at home in her house. She has some neighbors and a few friends, her close friends have all died or are far away. She reads the newspaper, she does al the crossword puzzles. She has a schizophrenic son who visits every day. Denies any falls. She feels her memory loss is commensurate with her age. She denies any dementia or significant memory issues. She feels she does fine and she will live in her home and  will not go to a retirement community. No other accidents in the home. No other neurologic complaints or symptoms.  Reviewed notes, labs and imaging from outside physicians, which showed: Reviewed notes from pcp. She is having short-term memory problems, lives alone, closest relative in [place], has left the tea kettle on x2 and repeats questions. Per Dr. Osborne Casco, during discussion of memory patient repeated  herself quite a lot, asked the same questions, admitted about the teapot, has a son in [place] that calls once a month and sees him a few times a year, she is determined to continue living in her home, neighbors check in on her. Exam including ent, neck, lungs, CV, GI, were nml. Had some dec rom in the pelvis,spine, she has chronic back pain   REVIEW OF SYSTEMS: Out of a complete 14 system review of symptoms, the patient complains only of the following symptoms, and all other reviewed systems are negative.  Frequency of urination, joint pain, back pain, walking difficulty, depression, nervous/anxious, weakness, memory loss, constipation, frequent waking, activity change  ALLERGIES: No Known Allergies  HOME MEDICATIONS: Outpatient Medications Prior to Visit  Medication Sig Dispense Refill  . aspirin 81 MG tablet Take 81 mg by mouth daily.    . Calcium Carbonate-Vitamin D (CALCIUM-D PO) Take 600 mg by mouth 2 (two) times daily.    Marland Kitchen donepezil (ARICEPT) 10 MG tablet Take 1 tablet (10 mg total) by mouth at bedtime. 90 tablet 4  . etodolac (LODINE) 400 MG tablet Take 400 mg by mouth daily.    . hydrochlorothiazide (HYDRODIURIL) 25 MG tablet Take 25 mg by mouth daily.    Marland Kitchen loratadine (CLARITIN) 10 MG tablet Take 10 mg by mouth daily.    . mirtazapine (REMERON) 7.5 MG tablet Take 7.5 mg by mouth daily.  0  . HYDROcodone-acetaminophen (NORCO/VICODIN) 5-325 MG tablet Take 1 tablet by mouth 2 (two) times daily.  0   No facility-administered medications prior to visit.     PAST MEDICAL HISTORY: Past Medical History:  Diagnosis Date  . Allergic rhinitis   . Anxiety   . Breast cyst 9/94  . Chronic kidney disease (CKD), stage III (moderate)   . Cluster headaches   . Diverticulosis   . Hematuria    evaluation showed stones but still with hematuria  . Hyperlipemia   . Hypertension   . Kidney stones 1970  . Memory loss   . Osteoarthritis   . Osteopenia   . SCC (squamous cell carcinoma)  4/11   of face and leg  . Spinal stenosis     PAST SURGICAL HISTORY: Past Surgical History:  Procedure Laterality Date  . APPENDECTOMY    . BREAST BIOPSY  5/12   stromal fibrosis and microcalcifications  . CHOLECYSTECTOMY  1977  . COLONOSCOPY  10/24/2012  . SKIN BIOPSY  4/11    squamous cell ca    FAMILY HISTORY: Family History  Problem Relation Age of Onset  . Melanoma Mother   . Schizophrenia Son   . Heart attack Maternal Grandfather   . Anxiety disorder Unknown   . Dementia Neg Hx     SOCIAL HISTORY: Social History   Social History  . Marital status: Widowed    Spouse name: Deidre Ala  . Number of children: 4  . Years of education: 78   Occupational History  . Retired    Social History Main Topics  . Smoking status: Former Smoker    Quit date: 05/17/1946  . Smokeless tobacco: Never Used  .  Alcohol use 0.6 oz/week    1 Glasses of wine per week     Comment: occasionally/social  . Drug use: No  . Sexual activity: No   Other Topics Concern  . Not on file   Social History Narrative   Lives alone   Caffeine use: 2 drinks per day (hot tea)      PHYSICAL EXAM  Vitals:   11/01/16 1255  BP: 129/77  Pulse: (!) 101  Weight: 111 lb 9.6 oz (50.6 kg)   Body mass index is 18.01 kg/m.   MMSE - Mini Mental State Exam 11/01/2016 05/04/2016 11/04/2015  Orientation to time 4 4 4   Orientation to Place 5 5 5   Registration 3 3 3   Attention/ Calculation 4 5 5   Recall 0 2 1  Language- name 2 objects 2 2 2   Language- repeat 1 1 1   Language- follow 3 step command 3 3 3   Language- read & follow direction 1 1 1   Write a sentence 1 1 1   Copy design 1 1 1   Total score 25 28 27      Generalized: Well developed, in no acute distress   Neurological examination  Mentation: Alert. Follows all commands speech and language fluent Cranial nerve II-XII: Pupils were equal round reactive to light. Extraocular movements were full, visual field were full on confrontational test.  Facial sensation and strength were normal. Uvula tongue midline. Head turning and shoulder shrug  were normal and symmetric. Motor: The motor testing reveals 5 over 5 strength of all 4 extremities. Good symmetric motor tone is noted throughout.  Sensory: Sensory testing is intact to soft touch on all 4 extremities. No evidence of extinction is noted.  Coordination: Cerebellar testing reveals good finger-nose-finger and heel-to-shin bilaterally.  Gait and station: Gait is slightly unsteady..  Reflexes: Deep tendon reflexes are symmetric and normal bilaterally.   DIAGNOSTIC DATA (LABS, IMAGING, TESTING) - I reviewed patient records, labs, notes, testing and imaging myself where available.  Lab Results  Component Value Date   WBC 7.5 03/06/2016   HGB 12.3 03/06/2016   HCT 37.6 03/06/2016   MCV 95.2 03/06/2016   PLT 250 03/06/2016      Component Value Date/Time   NA 140 03/06/2016 1735   K 3.8 03/06/2016 1735   CL 100 (L) 03/06/2016 1735   CO2 29 03/06/2016 1735   GLUCOSE 104 (H) 03/06/2016 1735   BUN 25 (H) 03/06/2016 1735   CREATININE 1.21 (H) 03/06/2016 1735   CALCIUM 10.1 03/06/2016 1735   PROT 6.5 12/28/2007 1758   ALBUMIN 3.4 (L) 12/28/2007 1758   AST 33 12/28/2007 1758   ALT 32 12/28/2007 1758   ALKPHOS 52 12/28/2007 1758   BILITOT 0.8 12/28/2007 1758   GFRNONAA 39 (L) 03/06/2016 1735   GFRAA 45 (L) 03/06/2016 1735    Lab Results  Component Value Date   VITAMINB12 270 11/04/2015   Lab Results  Component Value Date   TSH 3.040 11/04/2015      ASSESSMENT AND PLAN 83 y.o. year old female  has a past medical history of Allergic rhinitis; Anxiety; Breast cyst (9/94); Chronic kidney disease (CKD), stage III (moderate); Cluster headaches; Diverticulosis; Hematuria; Hyperlipemia; Hypertension; Kidney stones (1970); Memory loss; Osteoarthritis; Osteopenia; SCC (squamous cell carcinoma) (4/11); and Spinal stenosis. here with:  1. Memory disturbance  Patient's memory  score has slightly declined since the last visit. MMSE today is 25 out of 30 was previously 28 out of 30. She will continue on Aricept 10  mg daily. We discussed adding Namenda. They would like to try this. The patient will start on 5 mg twice a day. If she is tolerating in a month they will call and we will increase her dose to 10 mg twice a day. She is advised that if her symptoms worsen or she develops new symptoms she shouldlet [place] know. She will follow-up in 6 months with Dr. Jaynee Eagles.  I spent 25 minutes with the patient. 50% of this time was spent reviewing her memory score and medication Namenda.      Ward Givens, MSN, NP-C 11/01/2016, 1:04 PM [facility] [address] [place], [place] [address] [phone]

## 2016-11-01 NOTE — Patient Instructions (Signed)
Continue Aricept  Start Namenda 5 mg twice a day If your symptoms worsen or you develop new symptoms please let us know.   Memantine Tablets What is this medicine? MEMANTINE (MEM an teen) is used to treat dementia caused by Alzheimer's disease. This medicine may be used for other purposes; ask your health care provider or pharmacist if you have questions. COMMON BRAND NAME(S): Namenda What should I tell my health care provider before I take this medicine? They need to know if you have any of these conditions: -difficulty passing urine -kidney disease -liver disease -seizures -an unusual or allergic reaction to memantine, other medicines, foods, dyes, or preservatives -pregnant or trying to get pregnant -breast-feeding How should I use this medicine? Take this medicine by mouth with a glass of water. Follow the directions on the prescription label. You may take this medicine with or without food. Take your doses at regular intervals. Do not take your medicine more often than directed. Continue to take your medicine even if you feel better. Do not stop taking except on the advice of your doctor or health care professional. Talk to your pediatrician regarding the use of this medicine in children. Special care may be needed. Overdosage: If you think you have taken too much of this medicine contact a poison control center or emergency room at once. NOTE: This medicine is only for you. Do not share this medicine with others. What if I miss a dose? If you miss a dose, take it as soon as you can. If it is almost time for your next dose, take only that dose. Do not take double or extra doses. If you do not take your medicine for several days, contact your health care provider. Your dose may need to be changed. What may interact with this  medicine? -acetazolamide -amantadine -cimetidine -dextromethorphan -dofetilide -hydrochlorothiazide -ketamine -metformin -methazolamide -quinidine -ranitidine -sodium bicarbonate -triamterene This list may not describe all possible interactions. Give your health care provider a list of all the medicines, herbs, non-prescription drugs, or dietary supplements you use. Also tell them if you smoke, drink alcohol, or use illegal drugs. Some items may interact with your medicine. What should I watch for while using this medicine? Visit your doctor or health care professional for regular checks on your progress. Check with your doctor or health care professional if there is no improvement in your symptoms or if they get worse. You may get drowsy or dizzy. Do not drive, use machinery, or do anything that needs mental alertness until you know how this drug affects you. Do not stand or sit up quickly, especially if you are an older patient. This reduces the risk of dizzy or fainting spells. Alcohol can make you more drowsy and dizzy. Avoid alcoholic drinks. What side effects may I notice from receiving this medicine? Side effects that you should report to your doctor or health care professional as soon as possible: -allergic reactions like skin rash, itching or hives, swelling of the face, lips, or tongue -agitation or a feeling of restlessness -depressed mood -dizziness -hallucinations -redness, blistering, peeling or loosening of the skin, including inside the mouth -seizures -vomiting Side effects that usually do not require medical attention (report to your doctor or health care professional if they continue or are bothersome): -constipation -diarrhea -headache -nausea -trouble sleeping This list may not describe all possible side effects. Call your doctor for medical advice about side effects. You may report side effects to FDA at 1-800-FDA-1088. Where should I keep  my medicine? Keep  out of the reach of children. Store at room temperature between 15 degrees and 30 degrees C (59 degrees and 86 degrees F). Throw away any unused medicine after the expiration date. NOTE: This sheet is a summary. It may not cover all possible information. If you have questions about this medicine, talk to your doctor, pharmacist, or health care provider.  2018 Elsevier/Gold Standard (2013-02-19 14:10:42)

## 2016-11-27 NOTE — Progress Notes (Signed)
Personally  participated in, made any corrections needed, and agree with history, physical, neuro exam,assessment and plan as stated.     Antonia Ahern, MD [facility]     

## 2017-04-11 ENCOUNTER — Telehealth: Payer: Self-pay | Admitting: Adult Health

## 2017-04-11 MED ORDER — MEMANTINE HCL 5 MG PO TABS: 5.0000 mg | ORAL_TABLET | Freq: Two times a day (BID) | ORAL | 1 refills | Status: DC

## 2017-04-11 NOTE — Telephone Encounter (Signed)
Patient's son calling to get a new Rx for memantine (NAMENDA) 5 MG tablet called to [facility].

## 2017-04-11 NOTE — Addendum Note (Signed)
Addended by: Minna Antis on: 04/11/2017 10:13 AM   Modules accepted: Orders

## 2017-05-03 ENCOUNTER — Ambulatory Visit (INDEPENDENT_AMBULATORY_CARE_PROVIDER_SITE_OTHER): Payer: Medicare Other | Admitting: Neurology

## 2017-05-03 ENCOUNTER — Encounter: Payer: Self-pay | Admitting: Neurology

## 2017-05-03 VITALS — BP 111/67 | HR 81 | Ht 67.0 in | Wt 107.4 lb

## 2017-05-03 DIAGNOSIS — R410 Disorientation, unspecified: Secondary | ICD-10-CM

## 2017-05-03 DIAGNOSIS — G301 Alzheimer's disease with late onset: Secondary | ICD-10-CM | POA: Diagnosis not present

## 2017-05-03 DIAGNOSIS — F0280 Dementia in other diseases classified elsewhere without behavioral disturbance: Secondary | ICD-10-CM

## 2017-05-03 DIAGNOSIS — R251 Tremor, unspecified: Secondary | ICD-10-CM | POA: Diagnosis not present

## 2017-05-03 DIAGNOSIS — E538 Deficiency of other specified B group vitamins: Secondary | ICD-10-CM

## 2017-05-03 DIAGNOSIS — R413 Other amnesia: Secondary | ICD-10-CM | POA: Diagnosis not present

## 2017-05-03 DIAGNOSIS — F0390 Unspecified dementia without behavioral disturbance: Secondary | ICD-10-CM

## 2017-05-03 DIAGNOSIS — G309 Alzheimer's disease, unspecified: Secondary | ICD-10-CM

## 2017-05-03 DIAGNOSIS — F05 Delirium due to known physiological condition: Secondary | ICD-10-CM | POA: Diagnosis not present

## 2017-05-03 DIAGNOSIS — E559 Vitamin D deficiency, unspecified: Secondary | ICD-10-CM

## 2017-05-03 MED ORDER — MEMANTINE HCL 10 MG PO TABS: 10.0000 mg | ORAL_TABLET | Freq: Two times a day (BID) | ORAL | 11 refills | Status: DC

## 2017-05-03 MED ORDER — DONEPEZIL HCL 10 MG PO TABS: 10.0000 mg | ORAL_TABLET | Freq: Every day | ORAL | 4 refills | Status: AC

## 2017-05-03 MED ORDER — MEMANTINE HCL 10 MG PO TABS: 10.0000 mg | ORAL_TABLET | Freq: Two times a day (BID) | ORAL | 4 refills | Status: AC

## 2017-05-03 NOTE — Patient Instructions (Signed)
F/u 6 months Increase Memantine(Namenda) to 10mg  twice daily Can consider increasing Aricept at next appointment Labs today  Dementia Dementia is the loss of two or more brain functions, such as:  Memory.  Decision making.  Behavior.  Speaking.  Thinking.  Problem solving.  There are many types of dementia. The most common type is called progressive dementia. Progressive dementia gets worse with time and it is irreversible. An example of this type of dementia is Alzheimer disease. What are the causes? This condition may be caused by:  Nerve cell damage in the brain.  Genetic mutations.  Certain medicines.  Multiple small strokes.  An infection, such as chronic meningitis.  A metabolic problem, such as vitamin B12 deficiency or thyroid disease.  Pressure on the brain, such as from a tumor or blood clot.  What are the signs or symptoms? Symptoms of this condition include:  Sudden changes in mood.  Depression.  Problems with balance.  Changes in personality.  Poor short-term memory.  Agitation.  Delusions.  Hallucinations.  Having a hard time: ? Speaking thoughts. ? Finding words. ? Solving problems. ? Doing familiar tasks. ? Understanding familiar ideas.  How is this diagnosed? This condition is diagnosed with an assessment by your health care provider. During this assessment, your health care provider will talk with you and your family, friends, or caregivers about your symptoms. A thorough medical history will be taken, and you will have a physical exam and tests. Tests may include:  Lab tests, such as blood or urine tests.  Imaging tests, such as a CT scan, PET scan, or MRI.  A lumbar puncture. This test involves removing and testing a small amount of the fluid that surrounds the brain and spinal cord.  An electroencephalogram (EEG). In this test, small metal discs are used to measure electrical activity in the brain.  Memory tests,  cognitive tests, and neuropsychological tests. These tests evaluate brain function.  How is this treated? Treatment depends on the cause of the dementia. It may involve taking medicines that may help:  To control the dementia.  To slow down the disease.  To manage symptoms.  In some cases, treating the cause of the dementia can improve symptoms, reverse symptoms, or slow down how quickly the dementia gets worse. Your health care provider can help direct you to support groups, organizations, and other health care providers who can help with decisions about your care. Follow these instructions at home: Medicine  Take over-the-counter and prescription medicines only as told by your health care provider.  Avoid taking medicines that can affect thinking, such as pain or sleeping medicines. Lifestyle   Make healthy lifestyle choices: ? Be physically active as told by your health care provider. ? Do not use any tobacco products, such as cigarettes, chewing tobacco, and e-cigarettes. If you need help quitting, ask your health care provider. ? Eat a healthy diet. ? Practice stress-management techniques when you get stressed. ? Stay social.  Drink enough fluid to keep your urine clear or pale yellow.  Make sure to get quality sleep. These tips can help you to get a good night's rest: ? Avoid napping during the day. ? Keep your sleeping area dark and cool. ? Avoid exercising during the few hours before you go to bed. ? Avoid caffeine products in the evening. General instructions  Work with your health care provider to determine what you need help with and what your safety needs are.  If you were given a  bracelet that tracks your location, make sure to wear it.  Keep all follow-up visits as told by your health care provider. This is important. Contact a health care provider if:  You have any new symptoms.  You have problems with choking or swallowing.  You have any symptoms of a  different illness. Get help right away if:  You develop a fever.  You have new or worsening confusion.  You have new or worsening sleepiness.  You have a hard time staying awake.  You or your family members become concerned for your safety. This information is not intended to replace advice given to you by your health care provider. Make sure you discuss any questions you have with your health care provider. Document Released: 10/27/2000 Document Revised: 09/11/2015 Document Reviewed: 01/29/2015 Elsevier Interactive Patient Education  2017 [employer].

## 2017-05-03 NOTE — Progress Notes (Signed)
[facility]    Provider:  Dr Jaynee Eagles Referring Provider: Osborne Casco Fransico Him, MD Primary Care Physician:  Haywood Pao, MD   CC:  Short-term memory changes  Interval history 05/03/2017: This is a lovely patient here with her son for likely early dementia.  She is on Aricept and Namenda.  In the past her B12 has been low normal and we will check this again today.  She lives at home alone and declines moving to an assisted facility, we have discussed this at length.  She is no longer operating a motor vehicle which is good.  She can complete all her ADLs independently, prepares her own meals, needs help with her IADLs.  She repeats conversations.  Mini-Mental status exams have been slightly declining from May 27 in June 2017 to a 25 in June 2018.  She reports today she is noncompliant with her medications. They have hired a caregiver 2 days a week and sons visit.  She is here with son who provides much information. She forgets to take her medications some days. She goes to bed at 10 and sleeps until 10am. Her son has HCPOA and POA. She feels sad most days, she has never been too social, she feels like her life is not worth anything. Son does not think she would do well in an assisted living facility or memory unit, I encouraged it but the patient and family have already discussed. Son fills up her medication case. She eats well and has good appetite but she has no interest in cooking. Lots of frustration, has b=never been a nurturer, she will sit in front of the television all day long.  HPI:  Alicia Harrison is a lovely 83 y.o. female here as a referral from Dr. Osborne Casco for short-term memory changes. PMHx HLD, spinal stenosis, cluster ha, kidney stones, CKD, memory loss, HTN, DDD lumbar. Hld. She has noticed some memory issue for a few years, slowly progressive. She loses things, she has to write everything down. She says she lives alone, her husband died a year ago. She was  married for 50 years. She is a former Engineer, maintenance (IT) so the bills are taken care of, she has had to learn about machines and maintenance since her husband died. She was late once on a bill but denies any other issues with bills. She doesn't do much cooking, husband used to do that. She just heats things up now and uses the microwave. She left a tea kettle on twice, all the water burned away and she had to throw the kettle away. I aked for her son's address and phone and she says she can't remember will get it to us(doesn't know it off hand), he lives in [place] and she speaks and sees him occassionally. A friend and her spent a few days touring the retirment communities and she prefers to live at home in her house. She has some neighbors and a few friends, her close friends have all died or are far away. She reads the newspaper, she does al the crossword puzzles. She has a schizophrenic son who visits every day. Denies any falls. She feels her memory loss is commensurate with her age. She denies any dementia or significant memory issues. She feels she does fine and she will live in her home and will not go to a retirement community. No other accidents in the home. No other neurologic complaints or symptoms.  Reviewed notes, labs and imaging from outside physicians, which showed: Reviewed  notes from pcp. She is having short-term memory problems, lives alone, closest relative in [place], has left the tea kettle on x2 and repeats questions. Per Dr. Osborne Casco, during discussion of memory patient repeated herself quite a lot, asked the same questions, admitted about the teapot, has a son in [place] that calls once a month and sees him a few times a year, she is determined to continue living in her home, neighbors check in on her. Exam including ent, neck, lungs, CV, GI, were nml. Had some dec rom in the pelvis,spine, she has chronic back pain.  Review of Systems: Patient complains of symptoms per HPI as well as the  following symptoms: fatigue, anxiety, hearing loss, constopation, memory loss, confusion, weakness, anxiety. Pertinent negatives per HPI. All others negative.  Social History   Socioeconomic History  . Marital status: Widowed    Spouse name: Deidre Ala  . Number of children: 4  . Years of education: 40  . Highest education level: Not on file  Social Needs  . Financial resource strain: Not on file  . Food insecurity - worry: Not on file  . Food insecurity - inability: Not on file  . Transportation needs - medical: Not on file  . Transportation needs - non-medical: Not on file  Occupational History  . Occupation: Retired  Tobacco Use  . Smoking status: Former Smoker    Last attempt to quit: 05/17/1946    Years since quitting: 71.0  . Smokeless tobacco: Never Used  Substance and Sexual Activity  . Alcohol use: Yes    Alcohol/week: 0.6 - 1.2 oz    Types: 1 - 2 Glasses of wine per week    Comment: occasionally/social  . Drug use: No  . Sexual activity: No  Other Topics Concern  . Not on file  Social History Narrative   Lives alone   Her son reports poor compliance with AM meds; he will explore other ways to ensure compliance.    Caffeine use: 2 drinks per day (hot tea)    Family History  Problem Relation Age of Onset  . Melanoma Mother   . Schizophrenia Son   . Heart attack Maternal Grandfather   . Anxiety disorder Unknown   . Dementia Neg Hx     Past Medical History:  Diagnosis Date  . Allergic rhinitis   . Anxiety   . Breast cyst 9/94  . Chronic kidney disease (CKD), stage III (moderate) (HCC)   . Cluster headaches   . Diverticulosis   . Hematuria    evaluation showed stones but still with hematuria  . Hyperlipemia   . Hypertension   . Kidney stones 1970  . Memory loss   . Osteoarthritis   . Osteopenia   . SCC (squamous cell carcinoma) 4/11   of face and leg  . Spinal stenosis     Past Surgical History:  Procedure Laterality Date  . APPENDECTOMY    .  BREAST BIOPSY  5/12   stromal fibrosis and microcalcifications  . CHOLECYSTECTOMY  1977  . COLONOSCOPY  10/24/2012  . SKIN BIOPSY  4/11    squamous cell ca    Current Outpatient Medications  Medication Sig Dispense Refill  . aspirin 81 MG tablet Take 81 mg by mouth daily.    . Calcium Carbonate-Vitamin D (CALCIUM-D PO) Take 600 mg by mouth 2 (two) times daily.    Marland Kitchen donepezil (ARICEPT) 10 MG tablet Take 1 tablet (10 mg total) by mouth at bedtime. 90 tablet 4  .  etodolac (LODINE) 400 MG tablet Take 400 mg by mouth daily as needed.     . hydrochlorothiazide (HYDRODIURIL) 25 MG tablet Take 25 mg by mouth daily.    Marland Kitchen loratadine (CLARITIN) 10 MG tablet Take 10 mg by mouth daily.    . mirtazapine (REMERON) 7.5 MG tablet Take 15 mg by mouth at bedtime.   0  . traMADol (ULTRAM) 50 MG tablet 50 mg.  0  . vitamin B-12 (CYANOCOBALAMIN) 1000 MCG tablet Take 1,000 mcg by mouth daily.    . memantine (NAMENDA) 10 MG tablet Take 1 tablet (10 mg total) by mouth 2 (two) times daily. 180 tablet 4   No current facility-administered medications for this visit.     Allergies as of 05/03/2017  . (No Known Allergies)    Vitals: BP 111/67 (BP Location: Right Arm, Patient Position: Sitting)   Pulse 81   Ht 5\' 7"  (1.702 m)   Wt 107 lb 6.4 oz (48.7 kg)   LMP 05/17/1988   BMI 16.82 kg/m  Last Weight:  Wt Readings from Last 1 Encounters:  05/03/17 107 lb 6.4 oz (48.7 kg)   Last Height:   Ht Readings from Last 1 Encounters:  05/03/17 5\' 7"  (1.702 m)       Assessment/Plan:  lovely 83 y.o. female here as a referral from Dr. Osborne Casco for short-term memory changes. PMHx HLD, spinal stenosis, cluster ha, kidney stones, CKD, memory loss, HTN, DDD lumbar. Hld. She has noticed some memory issue for a few years, slowly progressive. There are concerning issues, patient appears to have cognitive difficulties and lives alone at home. She has forgotten about the T-Kettle on the stove several times. Her Mini-Mental  Status exam was 27 out of 30 which is somewhat reassuring.  She is determined to live in her home and will not consider retirement community. She states she has a Schizophrenic son that comes to see her every day, but her other son is in [place] and doesn't visit often.She is open to a workup.we'll check B12 and thyroid, MRI of the brain without contrast. She is also accepting of starting Aricept which is wonderful.we'll complete and follow her closely in 4 months. MRI of the brain was completed in 2010, that report is not in EPIC however will try to request a CD of images and the report if still in existence.   B12 low-normal, 270. Asked her to take B12 orally, will check B12 and Vit D Depression: F/u with pcp this week, this can be common in dementia POA/HCPOA: completed, son [name] Already discussed memory unit and 24x7 care with patient and family, they are well aware of recommendation Increase memantine Can consider increase in Aricept at next appointment Discussed clinical trials with family today  Sarina Ill, MD  [facility] [address] [place], [place] [address]  Phone [phone] Fax [phone]  A total of 25 minutes was spent face-to-face with this patient. Over half this time was spent on counseling patient on the dementia diagnosis and different diagnostic and therapeutic options available.

## 2017-05-05 ENCOUNTER — Telehealth: Payer: Self-pay | Admitting: *Deleted

## 2017-05-05 NOTE — Telephone Encounter (Addendum)
Called and spoke with pt's daughter Alicia Harrison (on [place]). She verbalized understanding of her mother Alicia Harrison's lab results: B12 and vitamin D levels look good. She had no questions.   ----- Message from Melvenia Beam, MD sent at 05/04/2017  8:30 AM EST ----- B12 and Vitamin D levels look good thanks

## 2017-05-06 LAB — VITAMIN D 25 HYDROXY (VIT D DEFICIENCY, FRACTURES): VIT D 25 HYDROXY: 54 ng/mL (ref 30.0–100.0)

## 2017-05-06 LAB — B12 AND FOLATE PANEL
FOLATE: 9.1 ng/mL (ref >3.0)
Vitamin B-12: >2000 pg/mL — ABNORMAL HIGH (ref 232–1245)

## 2017-05-06 LAB — METHYLMALONIC ACID, SERUM: Methylmalonic Acid: 243 nmol/L (ref 0–378)

## 2017-11-03 ENCOUNTER — Encounter: Payer: Self-pay | Admitting: Adult Health

## 2017-11-03 ENCOUNTER — Ambulatory Visit: Payer: Medicare Other | Admitting: Adult Health

## 2019-07-19 ENCOUNTER — Ambulatory Visit: Payer: Medicare Other | Attending: Internal Medicine

## 2019-07-19 DIAGNOSIS — Z23 Encounter for immunization: Secondary | ICD-10-CM | POA: Insufficient documentation

## 2019-07-19 NOTE — Progress Notes (Signed)
   Covid-19 Vaccination Clinic  Name:  Alicia Harrison    MRN: [id number] DOB: 1925/07/23  07/19/2019  Alicia Harrison was observed post Covid-19 immunization for 15 minutes without incident. She was provided with Vaccine Information Sheet and instruction to access the V-Safe system.   Alicia Harrison was instructed to call 911 with any severe reactions post vaccine: Marland Kitchen Difficulty breathing  . Swelling of face and throat  . A fast heartbeat  . A bad rash all over body  . Dizziness and weakness

## 2019-08-15 ENCOUNTER — Ambulatory Visit: Payer: Medicare Other | Attending: Internal Medicine

## 2019-08-15 DIAGNOSIS — Z23 Encounter for immunization: Secondary | ICD-10-CM

## 2019-08-15 NOTE — Progress Notes (Signed)
   Covid-19 Vaccination Clinic  Name:  Alicia Harrison    MRN: [id number] DOB: 1925-09-06  08/15/2019  [name] was observed post Covid-19 immunization for 15 minutes without incident. She was provided with Vaccine Information Sheet and instruction to access the V-Safe system.   [name] was instructed to call 911 with any severe reactions post vaccine: Marland Kitchen Difficulty breathing  . Swelling of face and throat  . A fast heartbeat  . A bad rash all over body  . Dizziness and weakness   Immunizations Administered    Name Date Dose VIS Date Route   Pfizer COVID-19 Vaccine 08/15/2019  9:45 AM 0.3 mL 04/27/2019 Intramuscular   Manufacturer: [employer], [employer]   Lot: [id number]   [place]: [id number]

## 2019-08-18 ENCOUNTER — Emergency Department (HOSPITAL_COMMUNITY): Payer: Medicare Other

## 2019-08-18 ENCOUNTER — Other Ambulatory Visit: Payer: Self-pay

## 2019-08-18 ENCOUNTER — Encounter (HOSPITAL_COMMUNITY): Payer: Self-pay

## 2019-08-18 ENCOUNTER — Emergency Department (HOSPITAL_COMMUNITY)
Admission: EM | Admit: 2019-08-18 | Discharge: 2019-08-18 | Disposition: A | Discharge status: Home / Self Care | Payer: Medicare Other | Attending: Emergency Medicine | Admitting: Emergency Medicine

## 2019-08-18 DIAGNOSIS — F0390 Unspecified dementia without behavioral disturbance: Secondary | ICD-10-CM | POA: Insufficient documentation

## 2019-08-18 DIAGNOSIS — Y999 Unspecified external cause status: Secondary | ICD-10-CM | POA: Diagnosis not present

## 2019-08-18 DIAGNOSIS — I129 Hypertensive chronic kidney disease with stage 1 through stage 4 chronic kidney disease, or unspecified chronic kidney disease: Secondary | ICD-10-CM | POA: Insufficient documentation

## 2019-08-18 DIAGNOSIS — Z7982 Long term (current) use of aspirin: Secondary | ICD-10-CM | POA: Diagnosis not present

## 2019-08-18 DIAGNOSIS — M79601 Pain in right arm: Secondary | ICD-10-CM | POA: Diagnosis present

## 2019-08-18 DIAGNOSIS — W19XXXA Unspecified fall, initial encounter: Secondary | ICD-10-CM | POA: Diagnosis not present

## 2019-08-18 DIAGNOSIS — Z79899 Other long term (current) drug therapy: Secondary | ICD-10-CM | POA: Insufficient documentation

## 2019-08-18 DIAGNOSIS — Y939 Activity, unspecified: Secondary | ICD-10-CM | POA: Insufficient documentation

## 2019-08-18 DIAGNOSIS — Y92009 Unspecified place in unspecified non-institutional (private) residence as the place of occurrence of the external cause: Secondary | ICD-10-CM

## 2019-08-18 DIAGNOSIS — Z8659 Personal history of other mental and behavioral disorders: Secondary | ICD-10-CM

## 2019-08-18 DIAGNOSIS — Z87891 Personal history of nicotine dependence: Secondary | ICD-10-CM | POA: Insufficient documentation

## 2019-08-18 DIAGNOSIS — R0789 Other chest pain: Secondary | ICD-10-CM | POA: Insufficient documentation

## 2019-08-18 DIAGNOSIS — N183 Chronic kidney disease, stage 3 unspecified: Secondary | ICD-10-CM | POA: Diagnosis not present

## 2019-08-18 DIAGNOSIS — Y92010 Kitchen of single-family (private) house as the place of occurrence of the external cause: Secondary | ICD-10-CM | POA: Insufficient documentation

## 2019-08-18 HISTORY — DX: Unspecified dementia, unspecified severity, without behavioral disturbance, psychotic disturbance, mood disturbance, and anxiety: F03.90

## 2019-08-18 LAB — URINALYSIS, ROUTINE W REFLEX MICROSCOPIC
Bilirubin Urine: NEGATIVE
Glucose, UA: NEGATIVE mg/dL
Ketones, ur: NEGATIVE mg/dL
Leukocytes,Ua: NEGATIVE
Nitrite: NEGATIVE
Protein, ur: NEGATIVE mg/dL
Specific Gravity, Urine: 1.011 (ref 1.005–1.030)
pH: 6 (ref 5.0–8.0)

## 2019-08-18 LAB — I-STAT CHEM 8, ED
BUN: 21 mg/dL (ref 8–23)
Calcium, Ion: 1.16 mmol/L (ref 1.15–1.40)
Chloride: 106 mmol/L (ref 98–111)
Creatinine, Ser: 1.2 mg/dL — ABNORMAL HIGH (ref 0.44–1.00)
Glucose, Bld: 114 mg/dL — ABNORMAL HIGH (ref 70–99)
HCT: 35 % — ABNORMAL LOW (ref 36.0–46.0)
Hemoglobin: 11.9 g/dL — ABNORMAL LOW (ref 12.0–15.0)
Potassium: 3.8 mmol/L (ref 3.5–5.1)
Sodium: 140 mmol/L (ref 135–145)
TCO2: 25 mmol/L (ref 22–32)

## 2019-08-18 MED ORDER — OLANZAPINE 5 MG PO TABS
2.5000 mg | ORAL_TABLET | Freq: Once | ORAL | Status: DC | PRN
  Filled 2019-08-18: qty 1

## 2019-08-18 MED ORDER — MEMANTINE HCL 10 MG PO TABS
10.0000 mg | ORAL_TABLET | Freq: Two times a day (BID) | ORAL | Status: DC
  Filled 2019-08-18: qty 1

## 2019-08-18 MED ORDER — ACETAMINOPHEN 325 MG PO TABS
650.0000 mg | ORAL_TABLET | Freq: Once | ORAL | Status: AC
  Administered 2019-08-18: 650 mg via ORAL
  Filled 2019-08-18: qty 2

## 2019-08-18 NOTE — ED Notes (Signed)
This RN walked into the room for the patient yelling. The patient is complaining of pain her arm from the BP cuff. Cuff was removed. Pt's family is not able to be reached at this time.

## 2019-08-18 NOTE — ED Provider Notes (Addendum)
[facility] EMERGENCY DEPARTMENT Provider Note   CSN: [id number] Arrival date & time: 08/18/19  0142     History Chief Complaint  Patient presents with  . Fall    LEVEL 5 CAVEAT 2/2 DEMENTIA  Alicia Harrison is a 86 y.o. female.   86 year old female with history of HLD, CKD, HTN, DDD, dementia presents to the ED after a fall.  Lives in a home alone; has caregivers that come during the day and son's check on her periodically (per Neurology notes).  According to EMS, scene looked as though patient fell in the kitchen and was able to slide herself into another room prior to pushing her life alert button.  No history of chronic anticoagulation.  Patient cannot recall what precipitated her fall tonight.  She does not believe that she is good about using assistive devices while in her home.  Reports some pain to her R arm and right chest wall.        Past Medical History:  Diagnosis Date  . Allergic rhinitis   . Anxiety   . Breast cyst 9/94  . Chronic kidney disease (CKD), stage III (moderate)   . Cluster headaches   . Dementia ([place])   . Diverticulosis   . Hematuria    evaluation showed stones but still with hematuria  . Hyperlipemia   . Hypertension   . Kidney stones 1970  . Memory loss   . Osteoarthritis   . Osteopenia   . SCC (squamous cell carcinoma) 4/11   of face and leg  . Spinal stenosis     Patient Active Problem List   Diagnosis Date Noted  . Alzheimer's dementia without behavioral disturbance ([place]) 05/03/2017  . Cognitive impairment 11/04/2015  . Chronic back pain 11/04/2015  . CKD (chronic kidney disease) 11/04/2015  . HTN (hypertension) 11/04/2015  . Blood in stool 09/08/2011    Past Surgical History:  Procedure Laterality Date  . APPENDECTOMY    . BREAST BIOPSY  5/12   stromal fibrosis and microcalcifications  . CHOLECYSTECTOMY  1977  . COLONOSCOPY  10/24/2012  . SKIN BIOPSY  4/11    squamous cell ca     OB History     Gravida  4   Para  4   Term      Preterm      AB      Living  4     SAB      TAB      Ectopic      Multiple      Live Births              Family History  Problem Relation Age of Onset  . Melanoma Mother   . Schizophrenia Son   . Heart attack Maternal Grandfather   . Anxiety disorder Other   . Dementia Neg Hx     Social History   Tobacco Use  . Smoking status: Former Smoker    Quit date: 05/17/1946    Years since quitting: 73.3  . Smokeless tobacco: Never Used  Substance Use Topics  . Alcohol use: Yes    Alcohol/week: 1.0 - 2.0 standard drinks    Types: 1 - 2 Glasses of wine per week    Comment: occasionally/social  . Drug use: No    Home Medications Prior to Admission medications   Medication Sig Start Date End Date Taking? Authorizing Provider  aspirin 81 MG tablet Take 81 mg by mouth daily.  [provider]  Calcium Carbonate-Vitamin D (CALCIUM-D PO) Take 600 mg by mouth 2 (two) times daily.    [provider]  donepezil (ARICEPT) 10 MG tablet Take 1 tablet (10 mg total) by mouth at bedtime. 05/03/17   Melvenia Beam, MD  etodolac (LODINE) 400 MG tablet Take 400 mg by mouth daily as needed.     [provider]  hydrochlorothiazide (HYDRODIURIL) 25 MG tablet Take 25 mg by mouth daily.    [provider]  loratadine (CLARITIN) 10 MG tablet Take 10 mg by mouth daily.    [provider]  memantine (NAMENDA) 10 MG tablet Take 1 tablet (10 mg total) by mouth 2 (two) times daily. 05/03/17   Melvenia Beam, MD  mirtazapine (REMERON) 7.5 MG tablet Take 15 mg by mouth at bedtime.  10/14/15   [provider]  traMADol (ULTRAM) 50 MG tablet 50 mg. 10/14/16   [provider]  vitamin B-12 (CYANOCOBALAMIN) 1000 MCG tablet Take 1,000 mcg by mouth daily.    [provider]    Allergies    Patient has no known allergies.  Review of Systems   Review of Systems  Unable to perform ROS:  Dementia  Ten systems reviewed and are negative for acute change, except as noted in the HPI.    Physical Exam Updated Vital Signs BP (!) 162/123   Pulse 89   Temp 98.1 F (36.7 C) (Oral)   Resp 18   Ht 5\' 7"  (1.702 m)   Wt 48.7 kg   LMP 05/17/1988   SpO2 98%   BMI 16.82 kg/m   Physical Exam Vitals and nursing note reviewed.  Constitutional:      General: She is not in acute distress.    Appearance: She is well-developed. She is not diaphoretic.     Comments: Nontoxic appearing and in NAD  HENT:     Head: Normocephalic and atraumatic.     Comments: No hematoma or contusion to scalp.  No battle's sign or raccoon's eyes.    Right Ear: External ear normal.     Left Ear: External ear normal.  Eyes:     General: No scleral icterus.    Extraocular Movements: Extraocular movements intact.     Conjunctiva/sclera: Conjunctivae normal.  Neck:     Comments: Arrived in cervical collar.  Midline was palpated with collar in place with no tenderness to palpation, step-offs, crepitus, deformity.  Collar removed and patient exhibiting normal range of motion.  Denies neck pain or pain with movement.  C-spine cleared. Cardiovascular:     Rate and Rhythm: Normal rate and regular rhythm.     Pulses: Normal pulses.  Pulmonary:     Effort: Pulmonary effort is normal. No respiratory distress.     Breath sounds: No stridor. No wheezing.     Comments: Lungs clear to auscultation bilaterally.  Respirations even and unlabored. Chest:     Chest wall: Tenderness (lateral, posterior right chest wall; no crepitus) present.  Musculoskeletal:        General: Normal range of motion.     Cervical back: Normal range of motion.     Comments: Mild tenderness to the proximal R humerus without crepitus or deformity.  Preserved range of motion of the right upper extremity.    No leg shortening or malrotation.  Able to actively flex and extend bilateral hips and knees.  No tenderness to palpation of the  pelvis.  No bony deformities, step-offs, crepitus to  the thoracic or lumbosacral midline.  No tenderness to palpation of back.  Skin:    General: Skin is warm and dry.     Coloration: Skin is not pale.     Findings: No erythema or rash.       Neurological:     Mental Status: She is alert.     Comments: GCS 15. Speech is goal oriented.  Answers questions appropriately and follows commands.  Patient has equal grip strength bilaterally with 5/5 strength against resistance in all major muscle groups bilaterally. Sensation to light touch intact. Patient moves extremities without ataxia.   Psychiatric:        Behavior: Behavior normal.     ED Results / Procedures / Treatments   Labs (all labs ordered are listed, but only abnormal results are displayed) Labs Reviewed  URINALYSIS, ROUTINE W REFLEX MICROSCOPIC - Abnormal; Notable for the following components:      Result Value   Hgb urine dipstick MODERATE (*)    Bacteria, UA MANY (*)    All other components within normal limits  I-STAT CHEM 8, ED - Abnormal; Notable for the following components:   Creatinine, Ser 1.20 (*)    Glucose, Bld 114 (*)    Hemoglobin 11.9 (*)    HCT 35.0 (*)    All other components within normal limits  URINE CULTURE    EKG None  Radiology DG Ribs Unilateral W/Chest Right  Result Date: 08/18/2019 CLINICAL DATA:  Recent fall with chest pain, initial encounter EXAM: RIGHT RIBS AND CHEST - 3+ VIEW COMPARISON:  03/06/2016 FINDINGS: Cardiac shadows within normal limits. Aortic calcifications are seen. Lungs are well aerated bilaterally. No focal infiltrate or sizable effusion is seen. No acute rib fractures are seen. Degenerative changes of the shoulder joints are noted. IMPRESSION: No acute rib abnormality noted. Electronically Signed   By: Inez Catalina M.D.   On: 08/18/2019 02:42   CT Head Wo Contrast  Result Date: 08/18/2019 CLINICAL DATA:  Fall EXAM: CT HEAD WITHOUT CONTRAST TECHNIQUE: Contiguous axial images  were obtained from the base of the skull through the vertex without intravenous contrast. COMPARISON:  None. FINDINGS: Brain: There is no mass, hemorrhage or extra-axial collection. There is generalized atrophy without lobar predilection. Hypodensity of the white matter is most commonly associated with chronic microvascular disease. Vascular: Atherosclerotic calcification of the internal carotid arteries at the skull base. No abnormal hyperdensity of the major intracranial arteries or dural venous sinuses. Skull: The visualized skull base, calvarium and extracranial soft tissues are normal. Sinuses/Orbits: No fluid levels or advanced mucosal thickening of the visualized paranasal sinuses. No mastoid or middle ear effusion. The orbits are normal. IMPRESSION: Chronic ischemic microangiopathy without acute intracranial abnormality. Electronically Signed   By: Ulyses Jarred M.D.   On: 08/18/2019 02:53   DG Humerus Right  Result Date: 08/18/2019 CLINICAL DATA:  Recent fall with right arm pain, initial encounter EXAM: RIGHT HUMERUS - 2+ VIEW COMPARISON:  None. FINDINGS: Degenerative changes of the glenohumeral joint are seen. No acute fracture or dislocation is noted. No soft tissue abnormality is noted. IMPRESSION: Degenerative change without acute bony abnormality. Electronically Signed   By: Inez Catalina M.D.   On: 08/18/2019 02:44    Procedures Procedures (including critical care time)  Medications Ordered in ED Medications  OLANZapine (ZYPREXA) tablet 2.5 mg (has no administration in time range)  acetaminophen (TYLENOL) tablet 650 mg (650 mg Oral Given 08/18/19 0430)    ED Course  I have reviewed  the triage vital signs and the nursing notes.  Pertinent labs & imaging results that were available during my care of the patient were reviewed by me and considered in my medical decision making (see chart for details).  Clinical Course as of Aug 18 651  Sat Aug 18, 2019  0200 Call left for patient's  son, Alicia Harrison, without answer. HIPAA compliant message left.   [KH]  0310 Imaging reviewed and reassuring. No evidence of acute traumatic injury.   [id number] Patient with bacteriuria but no other signs of UTI.  Culture has been sent.  Orders placed for ambulation of patient.   [KH]  [phone] Patient increasingly agitated.  Unable to recognize me as her provider.  Does not specifically recall what brought her to the emergency department.  Is complaining about her blood pressure cuff being tight.  She states that she wants to go home.  I explained to the patient that we are trying to find a safe way for her to return home.  Will give Tylenol for complaints of her arm pain as well as her morning dose of Namenda.  Attempting to avoid additional sedation in the setting of likely "sundowning" given anticipation for discharge.   [KH]  [phone] Repeat call to the patient's son, Alicia Harrison, went to voicemail.   [KH]  0423 Additional HIPAA compliant message left.   [KH]  [phone] Spoke with daughter, Alicia Harrison, regarding evaluation. Verbalizes understanding of work up. All questions answered.  Unfortunately, daughter resides in [place] and is unable to pick up the patient from the emergency department.  She did give alternate numbers for the patient's son, Alicia Harrison.  Will attempt to reach out again.   [KH]  [phone] HIPAA compliant voicemail left for Alicia Harrison, spouse of Alicia Harrison, at [phone]. Also attempted to call home number of [phone] without answer.   [KH]  0531 Repeat attempt to contact son without success.   [KH]  [phone] Patient ambulatory in the room with 1 person assist.  She is supposed to be using an assist device when ambulating in her home.  Additional call placed to patient's son, Alicia Harrison.  Attempted home and cell without answer.  Also called cell number for Alicia Harrison without answer.   [KH]  [phone] Did get in touch with other son, Alicia Harrison [phone]), who will  coordinate someone to either pick the patient up from the ED or be in the home for [facility] transport back. He will call back to confirm transportation plan.   [KH]    Clinical Course User Index [KH] Beverely Pace   MDM Rules/Calculators/A&P                      86 year old female presents to the emergency department following a fall at home.  Patient does have home health providers during the day, but is alone in her home at night.  It is unclear what precipitated her fall, but she has no outward evidence of significant trauma.  Had head CT as well as chest x-ray and right humeral x-ray completed, all of which are negative for acute injury or fracture.  Labs appear at baseline without electrolyte derangements.  Creatinine and hemoglobin are stable.  Urinalysis notable for bacteriuria without concern for UTI.  Culture sent.  Patient able to be discharged once family able to be contacted and confirm safe plan for transfer back to her house.  In order was placed PRN Zyprexa PO given intermittent agitation  while in the ED related to documented hx of dementia.  Patient signed out to Happy, PA-C at change of shift.   Final Clinical Impression(s) / ED Diagnoses Final diagnoses:  Fall in home, initial encounter  History of dementia    Rx / DC Orders ED Discharge Orders    None       Antonietta Breach, PA-C 08/18/19 0636    Antonietta Breach, PA-C [id number]    Delora Fuel, MD [id number] [phone]

## 2019-08-18 NOTE — ED Notes (Signed)
Pt arrived to Rm [facility] via recliner. Pt sitting in recliner drinking Coke. Pt in view of staff so may be monitored d/t high fall risk.

## 2019-08-18 NOTE — ED Notes (Signed)
Mardene Celeste, daughter, [phone] would like an update

## 2019-08-18 NOTE — ED Notes (Signed)
Patient denies pain and is resting comfortably.  

## 2019-08-18 NOTE — ED Notes (Addendum)
Reported that patient lives alone in a large home. EMS was unable to reach any family members.

## 2019-08-18 NOTE — ED Notes (Signed)
Pt in CT.

## 2019-08-18 NOTE — ED Notes (Signed)
Pt assisted w/ambulating to bathroom to void and had BM. Pt chose to sit back in recliner while waiting for [facility]. Pt drinking Coke given. Offered pt crackers - declined at this time. Pt voiced understanding of not attempting to get up w/o staff assistance.

## 2019-08-18 NOTE — ED Notes (Signed)
Pt ambulated in room with one person assistance.

## 2019-08-18 NOTE — ED Notes (Signed)
Pt assisted from recliner to bed d/t pt requesting to lie down while waiting for transport. Bed alarm activated as pt is high fall risk.

## 2019-08-18 NOTE — ED Notes (Addendum)
Pt's son called and advised caregiver is waiting on pt at home. [facility] has arrived to transport pt. Pt noted to be wearing her personal clothing and jacket. ALL paperwork given to [facility]. Pt unable to sign sig pad.

## 2019-08-18 NOTE — ED Triage Notes (Signed)
Patient arrived from home with a chief complaint of fall. It is reported by EMS at the scene it looked as if the patient fell in the kitchen and slid herself into another room prior to calling with her life alert button. Pt has hx of dementia.

## 2019-08-18 NOTE — ED Notes (Signed)
[facility] aware of need for transport.

## 2019-08-18 NOTE — ED Notes (Signed)
Pt continuously asking where she is and why she is here. States "I don't remember what happened?" Attempting to orient pt and ensure her safety.

## 2019-08-20 LAB — URINE CULTURE: Culture: >=100000 — AB

## 2019-08-21 ENCOUNTER — Telehealth: Payer: Self-pay

## 2019-08-21 NOTE — Telephone Encounter (Signed)
No abx needed for UC from ED 08/18/19 Per Pati Gallo PA

## 2019-10-02 ENCOUNTER — Inpatient Hospital Stay (HOSPITAL_COMMUNITY)
Admission: EM | Admit: 2019-10-02 | Discharge: 2019-10-09 | DRG: 689 | Disposition: A | Discharge status: Skilled Nursing Facility | Payer: Medicare Other | Attending: Internal Medicine | Admitting: Internal Medicine

## 2019-10-02 ENCOUNTER — Encounter (HOSPITAL_COMMUNITY): Payer: Self-pay

## 2019-10-02 ENCOUNTER — Emergency Department (HOSPITAL_COMMUNITY): Payer: Medicare Other

## 2019-10-02 ENCOUNTER — Other Ambulatory Visit: Payer: Self-pay

## 2019-10-02 DIAGNOSIS — I129 Hypertensive chronic kidney disease with stage 1 through stage 4 chronic kidney disease, or unspecified chronic kidney disease: Secondary | ICD-10-CM | POA: Diagnosis present

## 2019-10-02 DIAGNOSIS — F0390 Unspecified dementia without behavioral disturbance: Secondary | ICD-10-CM | POA: Diagnosis present

## 2019-10-02 DIAGNOSIS — W1830XA Fall on same level, unspecified, initial encounter: Secondary | ICD-10-CM | POA: Diagnosis present

## 2019-10-02 DIAGNOSIS — I10 Essential (primary) hypertension: Secondary | ICD-10-CM | POA: Diagnosis not present

## 2019-10-02 DIAGNOSIS — N3000 Acute cystitis without hematuria: Secondary | ICD-10-CM | POA: Diagnosis present

## 2019-10-02 DIAGNOSIS — N179 Acute kidney failure, unspecified: Secondary | ICD-10-CM | POA: Diagnosis present

## 2019-10-02 DIAGNOSIS — M8580 Other specified disorders of bone density and structure, unspecified site: Secondary | ICD-10-CM | POA: Diagnosis present

## 2019-10-02 DIAGNOSIS — B9620 Unspecified Escherichia coli [E. coli] as the cause of diseases classified elsewhere: Secondary | ICD-10-CM | POA: Diagnosis present

## 2019-10-02 DIAGNOSIS — E876 Hypokalemia: Secondary | ICD-10-CM | POA: Diagnosis present

## 2019-10-02 DIAGNOSIS — N183 Chronic kidney disease, stage 3 unspecified: Secondary | ICD-10-CM | POA: Diagnosis present

## 2019-10-02 DIAGNOSIS — Z8249 Family history of ischemic heart disease and other diseases of the circulatory system: Secondary | ICD-10-CM | POA: Diagnosis not present

## 2019-10-02 DIAGNOSIS — G301 Alzheimer's disease with late onset: Secondary | ICD-10-CM | POA: Diagnosis not present

## 2019-10-02 DIAGNOSIS — R4182 Altered mental status, unspecified: Secondary | ICD-10-CM | POA: Diagnosis present

## 2019-10-02 DIAGNOSIS — E785 Hyperlipidemia, unspecified: Secondary | ICD-10-CM | POA: Diagnosis present

## 2019-10-02 DIAGNOSIS — M1990 Unspecified osteoarthritis, unspecified site: Secondary | ICD-10-CM | POA: Diagnosis present

## 2019-10-02 DIAGNOSIS — Z818 Family history of other mental and behavioral disorders: Secondary | ICD-10-CM

## 2019-10-02 DIAGNOSIS — G8929 Other chronic pain: Secondary | ICD-10-CM | POA: Diagnosis present

## 2019-10-02 DIAGNOSIS — R400 Somnolence: Secondary | ICD-10-CM | POA: Diagnosis not present

## 2019-10-02 DIAGNOSIS — G9340 Encephalopathy, unspecified: Secondary | ICD-10-CM

## 2019-10-02 DIAGNOSIS — G9341 Metabolic encephalopathy: Secondary | ICD-10-CM | POA: Diagnosis present

## 2019-10-02 DIAGNOSIS — Y92009 Unspecified place in unspecified non-institutional (private) residence as the place of occurrence of the external cause: Secondary | ICD-10-CM

## 2019-10-02 DIAGNOSIS — Z66 Do not resuscitate: Secondary | ICD-10-CM | POA: Diagnosis present

## 2019-10-02 DIAGNOSIS — M549 Dorsalgia, unspecified: Secondary | ICD-10-CM | POA: Diagnosis present

## 2019-10-02 DIAGNOSIS — Z85828 Personal history of other malignant neoplasm of skin: Secondary | ICD-10-CM | POA: Diagnosis not present

## 2019-10-02 DIAGNOSIS — R5381 Other malaise: Secondary | ICD-10-CM | POA: Diagnosis present

## 2019-10-02 DIAGNOSIS — Z808 Family history of malignant neoplasm of other organs or systems: Secondary | ICD-10-CM | POA: Diagnosis not present

## 2019-10-02 DIAGNOSIS — E861 Hypovolemia: Secondary | ICD-10-CM | POA: Diagnosis present

## 2019-10-02 DIAGNOSIS — Z87891 Personal history of nicotine dependence: Secondary | ICD-10-CM

## 2019-10-02 DIAGNOSIS — Z20822 Contact with and (suspected) exposure to covid-19: Secondary | ICD-10-CM | POA: Diagnosis present

## 2019-10-02 DIAGNOSIS — Z87442 Personal history of urinary calculi: Secondary | ICD-10-CM

## 2019-10-02 DIAGNOSIS — N390 Urinary tract infection, site not specified: Secondary | ICD-10-CM | POA: Diagnosis not present

## 2019-10-02 DIAGNOSIS — Z9049 Acquired absence of other specified parts of digestive tract: Secondary | ICD-10-CM | POA: Diagnosis not present

## 2019-10-02 DIAGNOSIS — F0280 Dementia in other diseases classified elsewhere without behavioral disturbance: Secondary | ICD-10-CM | POA: Diagnosis not present

## 2019-10-02 LAB — URINALYSIS, ROUTINE W REFLEX MICROSCOPIC
Bilirubin Urine: NEGATIVE
Glucose, UA: NEGATIVE mg/dL
Ketones, ur: 5 mg/dL — AB
Leukocytes,Ua: NEGATIVE
Nitrite: POSITIVE — AB
Protein, ur: 100 mg/dL — AB
Specific Gravity, Urine: 1.016 (ref 1.005–1.030)
pH: 7 (ref 5.0–8.0)

## 2019-10-02 LAB — CBC WITH DIFFERENTIAL/PLATELET
Abs Immature Granulocytes: 0.05 10*3/uL (ref 0.00–0.07)
Basophils Absolute: 0 10*3/uL (ref 0.0–0.1)
Basophils Relative: 0 %
Eosinophils Absolute: 0 10*3/uL (ref 0.0–0.5)
Eosinophils Relative: 0 %
HCT: 39.5 % (ref 36.0–46.0)
Hemoglobin: 13 g/dL (ref 12.0–15.0)
Immature Granulocytes: 1 %
Lymphocytes Relative: 7 %
Lymphs Abs: 0.8 10*3/uL (ref 0.7–4.0)
MCH: 30.9 pg (ref 26.0–34.0)
MCHC: 32.9 g/dL (ref 30.0–36.0)
MCV: 93.8 fL (ref 80.0–100.0)
Monocytes Absolute: 0.5 10*3/uL (ref 0.1–1.0)
Monocytes Relative: 5 %
Neutro Abs: 9.6 10*3/uL — ABNORMAL HIGH (ref 1.7–7.7)
Neutrophils Relative %: 87 %
Platelets: 243 10*3/uL (ref 150–400)
RBC: 4.21 MIL/uL (ref 3.87–5.11)
RDW: 13.4 % (ref 11.5–15.5)
WBC: 11 10*3/uL — ABNORMAL HIGH (ref 4.0–10.5)
nRBC: 0 % (ref 0.0–0.2)

## 2019-10-02 LAB — BASIC METABOLIC PANEL
Anion gap: 10 (ref 5–15)
BUN: 26 mg/dL — ABNORMAL HIGH (ref 8–23)
CO2: 26 mmol/L (ref 22–32)
Calcium: 9.3 mg/dL (ref 8.9–10.3)
Chloride: 103 mmol/L (ref 98–111)
Creatinine, Ser: 1.18 mg/dL — ABNORMAL HIGH (ref 0.44–1.00)
GFR calc Af Amer: 47 mL/min — ABNORMAL LOW (ref >60)
GFR calc non Af Amer: 40 mL/min — ABNORMAL LOW (ref >60)
Glucose, Bld: 129 mg/dL — ABNORMAL HIGH (ref 70–99)
Potassium: 4.4 mmol/L (ref 3.5–5.1)
Sodium: 139 mmol/L (ref 135–145)

## 2019-10-02 LAB — SARS CORONAVIRUS 2 BY RT PCR (HOSPITAL ORDER, PERFORMED IN ~~LOC~~ HOSPITAL LAB): SARS Coronavirus 2: NEGATIVE

## 2019-10-02 MED ORDER — SODIUM CHLORIDE 0.9 % IV SOLN
1.0000 g | Freq: Once | INTRAVENOUS | Status: AC
  Administered 2019-10-02: 1 g via INTRAVENOUS
  Filled 2019-10-02: qty 10

## 2019-10-02 MED ORDER — ACETAMINOPHEN 325 MG PO TABS: 650.0000 mg | ORAL_TABLET | Freq: Four times a day (QID) | ORAL | Status: DC | PRN

## 2019-10-02 MED ORDER — ACETAMINOPHEN 325 MG PO TABS: 325.0000 mg | ORAL_TABLET | Freq: Four times a day (QID) | ORAL | Status: DC | PRN

## 2019-10-02 MED ORDER — SODIUM CHLORIDE 0.9 % IV SOLN: Freq: Once | INTRAVENOUS | Status: AC

## 2019-10-02 MED ORDER — SODIUM CHLORIDE 0.9 % IV SOLN
1.0000 g | INTRAVENOUS | Status: DC
  Administered 2019-10-03 – 2019-10-04 (×2): 1 g via INTRAVENOUS
  Filled 2019-10-02: qty 10
  Filled 2019-10-02 (×2): qty 1

## 2019-10-02 MED ORDER — HYDRALAZINE HCL 25 MG PO TABS
25.0000 mg | ORAL_TABLET | Freq: Four times a day (QID) | ORAL | Status: DC | PRN
  Administered 2019-10-02: 25 mg via ORAL
  Filled 2019-10-02: qty 1

## 2019-10-02 MED ORDER — DEXTROSE-NACL 5-0.9 % IV SOLN: INTRAVENOUS | Status: DC

## 2019-10-02 MED ORDER — HYDRALAZINE HCL 20 MG/ML IJ SOLN
10.0000 mg | Freq: Four times a day (QID) | INTRAMUSCULAR | Status: DC | PRN
  Administered 2019-10-05: 10 mg via INTRAVENOUS
  Filled 2019-10-02: qty 1

## 2019-10-02 MED ORDER — ACETAMINOPHEN 650 MG RE SUPP: 650.0000 mg | Freq: Four times a day (QID) | RECTAL | Status: DC | PRN

## 2019-10-02 MED ORDER — AMLODIPINE BESYLATE 5 MG PO TABS: 5.0000 mg | ORAL_TABLET | Freq: Every day | ORAL | Status: DC

## 2019-10-02 MED ORDER — ENOXAPARIN SODIUM 40 MG/0.4ML ~~LOC~~ SOLN
40.0000 mg | SUBCUTANEOUS | Status: DC
  Administered 2019-10-02 – 2019-10-08 (×7): 40 mg via SUBCUTANEOUS
  Filled 2019-10-02 (×6): qty 0.4

## 2019-10-02 MED ORDER — ONDANSETRON HCL 4 MG PO TABS: 4.0000 mg | ORAL_TABLET | Freq: Four times a day (QID) | ORAL | Status: DC | PRN

## 2019-10-02 MED ORDER — ONDANSETRON HCL 4 MG/2ML IJ SOLN: 4.0000 mg | Freq: Four times a day (QID) | INTRAMUSCULAR | Status: DC | PRN

## 2019-10-02 MED ORDER — AMLODIPINE BESYLATE 5 MG PO TABS
5.0000 mg | ORAL_TABLET | Freq: Every day | ORAL | Status: DC
  Administered 2019-10-03: 5 mg via ORAL
  Filled 2019-10-02: qty 1

## 2019-10-02 NOTE — ED Notes (Signed)
Called son, [name], to inform him of pts bed assignment. Left HIPPA compliant voicemail.

## 2019-10-02 NOTE — ED Provider Notes (Signed)
[facility] DEPT Provider Note   CSN: [id number] Arrival date & time: 10/02/19  1036     History Chief Complaint  Patient presents with  . Fall    Alicia Harrison is a 86 y.o. female.  Level 5 caveat due to dementia.  Patient with unwitnessed fall that occurred overnight.  Patient uses a walker and a cane up baseline.  Home health aides help take care of the patient but son also lives on her property and helps take care of her.  Patient is not on blood thinners.  She appears at her baseline.  The history is provided by the patient.  Fall This is a new problem. The current episode started 6 to 12 hours ago. Nothing aggravates the symptoms. Nothing relieves the symptoms. She has tried nothing for the symptoms. The treatment provided no relief.       Past Medical History:  Diagnosis Date  . Allergic rhinitis   . Anxiety   . Breast cyst 9/94  . Chronic kidney disease (CKD), stage III (moderate)   . Cluster headaches   . Dementia ([place])   . Diverticulosis   . Hematuria    evaluation showed stones but still with hematuria  . Hyperlipemia   . Hypertension   . Kidney stones 1970  . Memory loss   . Osteoarthritis   . Osteopenia   . SCC (squamous cell carcinoma) 4/11   of face and leg  . Spinal stenosis     Patient Active Problem List   Diagnosis Date Noted  . Alzheimer's dementia without behavioral disturbance ([place]) 05/03/2017  . Cognitive impairment 11/04/2015  . Chronic back pain 11/04/2015  . CKD (chronic kidney disease) 11/04/2015  . HTN (hypertension) 11/04/2015  . Blood in stool 09/08/2011    Past Surgical History:  Procedure Laterality Date  . APPENDECTOMY    . BREAST BIOPSY  5/12   stromal fibrosis and microcalcifications  . CHOLECYSTECTOMY  1977  . COLONOSCOPY  10/24/2012  . SKIN BIOPSY  4/11    squamous cell ca     OB History    Gravida  4   Para  4   Term      Preterm      AB      Living  4     SAB    TAB      Ectopic      Multiple      Live Births              Family History  Problem Relation Age of Onset  . Melanoma Mother   . Schizophrenia Son   . Heart attack Maternal Grandfather   . Anxiety disorder Other   . Dementia Neg Hx     Social History   Tobacco Use  . Smoking status: Former Smoker    Quit date: 05/17/1946    Years since quitting: 73.4  . Smokeless tobacco: Never Used  Substance Use Topics  . Alcohol use: Yes    Alcohol/week: 1.0 - 2.0 standard drinks    Types: 1 - 2 Glasses of wine per week    Comment: occasionally/social  . Drug use: No    Home Medications Prior to Admission medications   Medication Sig Start Date End Date Taking? Authorizing Provider  acetaminophen (TYLENOL) 325 MG tablet Take 325-650 mg by mouth every 6 (six) hours as needed for mild pain, moderate pain or headache.   Yes [provider]  donepezil (ARICEPT) 10  MG tablet Take 1 tablet (10 mg total) by mouth at bedtime. Patient not taking: Reported on 10/02/2019 05/03/17   Melvenia Beam, MD  memantine (NAMENDA) 10 MG tablet Take 1 tablet (10 mg total) by mouth 2 (two) times daily. Patient not taking: Reported on 10/02/2019 05/03/17   Melvenia Beam, MD    Allergies    Patient has no known allergies.  Review of Systems   Review of Systems  Unable to perform ROS: Dementia    Physical Exam Updated Vital Signs BP (!) 183/97   Pulse 83   Temp 98.2 F (36.8 C) (Axillary)   Resp 19   LMP 05/17/1988   SpO2 98%   Physical Exam Vitals and nursing note reviewed.  Constitutional:      General: She is not in acute distress.    Appearance: She is well-developed. She is not ill-appearing.  HENT:     Head: Normocephalic and atraumatic.     Nose: Nose normal.     Mouth/Throat:     Mouth: Mucous membranes are moist.  Eyes:     Extraocular Movements: Extraocular movements intact.     Conjunctiva/sclera: Conjunctivae normal.     Pupils: Pupils are equal, round,  and reactive to light.  Cardiovascular:     Rate and Rhythm: Normal rate and regular rhythm.     Pulses: Normal pulses.     Heart sounds: Normal heart sounds. No murmur.  Pulmonary:     Effort: Pulmonary effort is normal. No respiratory distress.     Breath sounds: Normal breath sounds.  Abdominal:     Palpations: Abdomen is soft.     Tenderness: There is no abdominal tenderness.  Musculoskeletal:        General: No tenderness or deformity.     Cervical back: Normal range of motion and neck supple. No tenderness.  Skin:    General: Skin is warm and dry.  Neurological:     General: No focal deficit present.     Mental Status: She is alert.     Comments: Moves all extremities, follows commands somewhat but appears confused, can tell me her name     ED Results / Procedures / Treatments   Labs (all labs ordered are listed, but only abnormal results are displayed) Labs Reviewed  URINALYSIS, ROUTINE W REFLEX MICROSCOPIC - Abnormal; Notable for the following components:      Result Value   APPearance HAZY (*)    Hgb urine dipstick MODERATE (*)    Ketones, ur 5 (*)    Protein, ur 100 (*)    Nitrite POSITIVE (*)    Bacteria, UA RARE (*)    All other components within normal limits  CBC WITH DIFFERENTIAL/PLATELET - Abnormal; Notable for the following components:   WBC 11.0 (*)    Neutro Abs 9.6 (*)    All other components within normal limits  BASIC METABOLIC PANEL - Abnormal; Notable for the following components:   Glucose, Bld 129 (*)    BUN 26 (*)    Creatinine, Ser 1.18 (*)    GFR calc non Af Amer 40 (*)    GFR calc Af Amer 47 (*)    All other components within normal limits  URINE CULTURE  SARS CORONAVIRUS 2 BY RT PCR (HOSPITAL ORDER, [facility])    EKG None  Radiology CT Head Wo Contrast  Result Date: 10/02/2019 CLINICAL DATA:  Recent fall EXAM: CT HEAD WITHOUT CONTRAST CT CERVICAL SPINE  WITHOUT CONTRAST TECHNIQUE: Multidetector CT  imaging of the head and cervical spine was performed following the standard protocol without intravenous contrast. Multiplanar CT image reconstructions of the cervical spine were also generated. COMPARISON:  08/18/2019 FINDINGS: CT HEAD FINDINGS Brain: Mild atrophic changes and chronic white matter ischemic changes are noted. No findings to suggest acute hemorrhage, acute infarction or space-occupying mass lesion are seen. Vascular: No hyperdense vessel or unexpected calcification. Skull: Normal. Negative for fracture or focal lesion. Sinuses/Orbits: No acute finding. Other: None. CT CERVICAL SPINE FINDINGS Alignment: Mild anterolisthesis of C4 on C5 of a degenerative nature is noted. Anterolisthesis of C7 on T1 is noted also of a degenerative nature. Skull base and vertebrae: 7 cervical segments are well visualized. Vertebral body height is well maintained. The odontoid is within normal limits. Disc space narrowing is seen from C5-C7. Mild associated osteophytic changes are noted. Multilevel facet hypertrophic changes are seen. No acute fracture is noted. Soft tissues and spinal canal: Surrounding soft tissue structures appear within normal limits. Upper chest: Visualized lung apices are unremarkable. Other: None IMPRESSION: CT of the head: Chronic atrophic and ischemic changes without acute abnormality. CT of the cervical spine: Multilevel degenerative change with mild anterolisthesis. No acute abnormality noted. Electronically Signed   By: Inez Catalina M.D.   On: 10/02/2019 12:08   CT Cervical Spine Wo Contrast  Result Date: 10/02/2019 CLINICAL DATA:  Recent fall EXAM: CT HEAD WITHOUT CONTRAST CT CERVICAL SPINE WITHOUT CONTRAST TECHNIQUE: Multidetector CT imaging of the head and cervical spine was performed following the standard protocol without intravenous contrast. Multiplanar CT image reconstructions of the cervical spine were also generated. COMPARISON:  08/18/2019 FINDINGS: CT HEAD FINDINGS Brain: Mild  atrophic changes and chronic white matter ischemic changes are noted. No findings to suggest acute hemorrhage, acute infarction or space-occupying mass lesion are seen. Vascular: No hyperdense vessel or unexpected calcification. Skull: Normal. Negative for fracture or focal lesion. Sinuses/Orbits: No acute finding. Other: None. CT CERVICAL SPINE FINDINGS Alignment: Mild anterolisthesis of C4 on C5 of a degenerative nature is noted. Anterolisthesis of C7 on T1 is noted also of a degenerative nature. Skull base and vertebrae: 7 cervical segments are well visualized. Vertebral body height is well maintained. The odontoid is within normal limits. Disc space narrowing is seen from C5-C7. Mild associated osteophytic changes are noted. Multilevel facet hypertrophic changes are seen. No acute fracture is noted. Soft tissues and spinal canal: Surrounding soft tissue structures appear within normal limits. Upper chest: Visualized lung apices are unremarkable. Other: None IMPRESSION: CT of the head: Chronic atrophic and ischemic changes without acute abnormality. CT of the cervical spine: Multilevel degenerative change with mild anterolisthesis. No acute abnormality noted. Electronically Signed   By: Inez Catalina M.D.   On: 10/02/2019 12:08   DG Pelvis Portable  Result Date: 10/02/2019 CLINICAL DATA:  Recent fall with hip pain, initial encounter EXAM: PORTABLE PELVIS 1 VIEWS COMPARISON:  None. FINDINGS: Pelvic ring is intact. No acute fracture is noted. No soft tissue abnormality is seen. IMPRESSION: No acute abnormality noted. Electronically Signed   By: Inez Catalina M.D.   On: 10/02/2019 11:47   DG Chest Portable 1 View  Result Date: 10/02/2019 CLINICAL DATA:  Fall EXAM: PORTABLE CHEST 1 VIEW COMPARISON:  08/18/2019 FINDINGS: The heart size and mediastinal contours are within normal limits. Both lungs are clear. No pleural effusion or pneumothorax. The visualized skeletal structures are grossly intact. Advanced  degenerative changes at the left glenohumeral joint. IMPRESSION: No acute  process in the chest. Electronically Signed   By: Macy Mis M.D.   On: 10/02/2019 11:46    Procedures Procedures (including critical care time)  Medications Ordered in ED Medications  cefTRIAXone (ROCEPHIN) 1 g in sodium chloride 0.9 % 100 mL IVPB (has no administration in time range)  0.9 %  sodium chloride infusion (has no administration in time range)    ED Course  I have reviewed the triage vital signs and the nursing notes.  Pertinent labs & imaging results that were available during my care of the patient were reviewed by me and considered in my medical decision making (see chart for details).    MDM Rules/Calculators/A&P                      ANTANIQUE GIUDICI is a 86 year old female history of dementia who presents the ED after unwitnessed fall.  Patient with normal vitals.  Likely fell early last night.  Home health aides help with patient at home.  Son lives on her property.  She appears pleasantly confused on exam.  Does not appear to have any obvious deformities or lacerations.  She was found lying down on her left side.  She moves all of her extremities without any issues.  Patient is not on any blood thinners.  Will get CT head, neck, chest x-ray, pelvic x-ray.  Will talk with her son who helps take care of her.  Caregiver and son both states that patient not quite herself last night.  Patient usually is very conversational.  Ambulates with a walker and a cane.  Does not appear at her baseline per caregiver at this time.  She appears mildly encephalopathic.  CT scan of head and neck are unremarkable.  Chest x-ray pelvic x-ray showed no traumatic injuries.  No infectious process in the lungs.  Lab work was obtained that showed mild leukocytosis.  Otherwise no significant anemia, electrolyte abnormality, kidney injury.  Urinalysis appears consistent with infection and likely the cause of her mental status  change.  Will admit for IV antibiotics and IV fluids.  Patient does not have 24/7 care at home and would continue to be a fall risk.  This chart was dictated using voice recognition software.  Despite best efforts to proofread,  errors can occur which can change the documentation meaning.    Final Clinical Impression(s) / ED Diagnoses Final diagnoses:  Acute cystitis without hematuria  Encephalopathy    Rx / DC Orders ED Discharge Orders    None       Lennice Sites, DO 10/02/19 1311

## 2019-10-02 NOTE — ED Triage Notes (Signed)
Pt BIBA from home.   Per EMS- Pt with unwitnessed fall this AM.  Home health aide found pt appx 0930 laying in floor. Pt has hx of dementia. Pt fell on hardwood floor.   Redness noted to left temporal area (pt found left side lying). Per EMS- pt not taking any prescribed meds as directed by PCP due to difficulty in convincing pt to swallow pills.

## 2019-10-02 NOTE — Progress Notes (Addendum)
TR[facility] H&P    Patient Demographics:    Alicia Harrison, is a 86 y.o. female  MRN: [id number]  DOB - 1926/01/22  Admit Date - 10/02/2019  Referring MD/NP/PA: Dr. Ronnald Nian  Outpatient Primary MD for the patient is Tisovec, Fransico Him, MD  Patient coming from: Home  Chief complaint-altered mental status   HPI:    Alicia Harrison  is a 86 y.o. female, with history of dementia, had unwitnessed fall at home which occurred overnight.  At baseline patient uses walker and a cane.  Home health aides take care of the patient but son also lives on property and helps take care of her.  Today she was found on the floor, and was confused.  EMS was called and patient brought to the ED for further evaluation. In the ED patient was found to have abnormal UA, started on IV ceftriaxone.  Imaging studies including CT head and CT cervical spine was negative.  Chest x-ray was unremarkable.  Pelvic x-ray was unremarkable. Patient has dementia at baseline, and is confused.  Usually she is able to carry out a conversation but has not been able to talk. No other history is obtainable. She does not have history of diabetes mellitus. Patient does have history of dementia but is not taking her medications as per home health aide at bedside.    Review of systems:    In addition to the HPI above,   No other review of systems is obtainable    Past History of the following :    Past Medical History:  Diagnosis Date  . Allergic rhinitis   . Anxiety   . Breast cyst 9/94  . Chronic kidney disease (CKD), stage III (moderate)   . Cluster headaches   . Dementia ([place])   . Diverticulosis   . Hematuria    evaluation showed stones but still with hematuria  . Hyperlipemia   . Hypertension   . Kidney stones 1970  . Memory loss   . Osteoarthritis   . Osteopenia   . SCC (squamous cell carcinoma) 4/11   of face and leg  . Spinal stenosis        Past Surgical History:  Procedure Laterality Date  . APPENDECTOMY    . BREAST BIOPSY  5/12   stromal fibrosis and microcalcifications  . CHOLECYSTECTOMY  1977  . COLONOSCOPY  10/24/2012  . SKIN BIOPSY  4/11    squamous cell ca      Social History:      Social History   Tobacco Use  . Smoking status: Former Smoker    Quit date: 05/17/1946    Years since quitting: 73.4  . Smokeless tobacco: Never Used  Substance Use Topics  . Alcohol use: Yes    Alcohol/week: 1.0 - 2.0 standard drinks    Types: 1 - 2 Glasses of wine per week    Comment: occasionally/social       Family History :     Family History  Problem Relation Age of Onset  . Melanoma Mother   .  Schizophrenia Son   . Heart attack Maternal Grandfather   . Anxiety disorder Other   . Dementia Neg Hx       Home Medications:   Prior to Admission medications   Medication Sig Start Date End Date Taking? Authorizing Provider  acetaminophen (TYLENOL) 325 MG tablet Take 325-650 mg by mouth every 6 (six) hours as needed for mild pain, moderate pain or headache.   Yes [provider]  donepezil (ARICEPT) 10 MG tablet Take 1 tablet (10 mg total) by mouth at bedtime. Patient not taking: Reported on 10/02/2019 05/03/17   Melvenia Beam, MD  memantine (NAMENDA) 10 MG tablet Take 1 tablet (10 mg total) by mouth 2 (two) times daily. Patient not taking: Reported on 10/02/2019 05/03/17   Melvenia Beam, MD     Allergies:    No Known Allergies   Physical Exam:   Vitals  Blood pressure (!) 188/96, pulse 82, temperature 99.4 F (37.4 C), temperature source Axillary, resp. rate 19, last menstrual period 05/17/1988, SpO2 100 %.  1.  General: Appears in no acute distress  2. Psychiatric: Alert, not oriented  3. Neurologic: Alert, moving all extremities  4. HEENMT:  Atraumatic normocephalic, extraocular muscles are intact  5. Respiratory : Clear to auscultation bilaterally  6. Cardiovascular  : S1-S2, regular, no murmur auscultated  7. Gastrointestinal:  Abdomen is soft, nontender, no organomegaly  8. Skin:  No rashes noted  9.Musculoskeletal:  No limitation of range of motion at bilateral hip joint    Data Review:    CBC Recent Labs  Lab 10/02/19 1228  WBC 11.0*  HGB 13.0  HCT 39.5  PLT 243  MCV 93.8  MCH 30.9  MCHC 32.9  RDW 13.4  LYMPHSABS 0.8  MONOABS 0.5  EOSABS 0.0  BASOSABS 0.0   ------------------------------------------------------------------------------------------------------------------  Results for orders placed or performed during the hospital encounter of 10/02/19 (from the past 48 hour(s))  Urinalysis, Routine w reflex microscopic     Status: Abnormal   Collection Time: 10/02/19 11:49 AM  Result Value Ref Range   Color, Urine YELLOW YELLOW   APPearance HAZY (A) CLEAR   Specific Gravity, Urine 1.016 1.005 - 1.030   pH 7.0 5.0 - 8.0   Glucose, UA NEGATIVE NEGATIVE mg/dL   Hgb urine dipstick MODERATE (A) NEGATIVE   Bilirubin Urine NEGATIVE NEGATIVE   Ketones, ur 5 (A) NEGATIVE mg/dL   Protein, ur 100 (A) NEGATIVE mg/dL   Nitrite POSITIVE (A) NEGATIVE   Leukocytes,Ua NEGATIVE NEGATIVE   RBC / HPF 11-20 0 - 5 RBC/hpf   WBC, UA 0-5 0 - 5 WBC/hpf   Bacteria, UA RARE (A) NONE SEEN   Squamous Epithelial / LPF 0-5 0 - 5   Mucus PRESENT     Comment: Performed at [facility], [place] [address] [place], [place] [address]  CBC with Differential     Status: Abnormal   Collection Time: 10/02/19 12:28 PM  Result Value Ref Range   WBC 11.0 (H) 4.0 - 10.5 K/uL   RBC 4.21 3.87 - 5.11 MIL/uL   Hemoglobin 13.0 12.0 - 15.0 g/dL   HCT 39.5 36.0 - 46.0 %   MCV 93.8 80.0 - 100.0 fL   MCH 30.9 26.0 - 34.0 pg   MCHC 32.9 30.0 - 36.0 g/dL   RDW 13.4 11.5 - 15.5 %   Platelets 243 150 - 400 K/uL   nRBC 0.0 0.0 - 0.2 %   Neutrophils Relative % 87 %  Neutro Abs 9.6 (H) 1.7 - 7.7 K/uL   Lymphocytes Relative 7 %   Lymphs Abs 0.8 0.7  - 4.0 K/uL   Monocytes Relative 5 %   Monocytes Absolute 0.5 0.1 - 1.0 K/uL   Eosinophils Relative 0 %   Eosinophils Absolute 0.0 0.0 - 0.5 K/uL   Basophils Relative 0 %   Basophils Absolute 0.0 0.0 - 0.1 K/uL   Immature Granulocytes 1 %   Abs Immature Granulocytes 0.05 0.00 - 0.07 K/uL    Comment: Performed at [facility], [place] [address] [place], [place] [id number]  Basic metabolic panel     Status: Abnormal   Collection Time: 10/02/19 12:28 PM  Result Value Ref Range   Sodium 139 135 - 145 mmol/L   Potassium 4.4 3.5 - 5.1 mmol/L   Chloride 103 98 - 111 mmol/L   CO2 26 22 - 32 mmol/L   Glucose, Bld 129 (H) 70 - 99 mg/dL    Comment: Glucose reference range applies only to samples taken after fasting for at least 8 hours.   BUN 26 (H) 8 - 23 mg/dL   Creatinine, Ser 1.18 (H) 0.44 - 1.00 mg/dL   Calcium 9.3 8.9 - 10.3 mg/dL   GFR calc non Af Amer 40 (L) >60 mL/min   GFR calc Af Amer 47 (L) >60 mL/min   Anion gap 10 5 - 15    Comment: Performed at [facility], [place] [address] [place], [place] [address]    Chemistries  Recent Labs  Lab 10/02/19 1228  NA 139  K 4.4  CL 103  CO2 26  GLUCOSE 129*  BUN 26*  CREATININE 1.18*  CALCIUM 9.3   ------------------------------------------------------------------------------------------------------------------  ------------------------------------------------------------------------------------------------------------------ GFR: CrCl cannot be calculated (Unknown ideal weight.). Liver Function Tests: No results for input(s): AST, ALT, ALKPHOS, BILITOT, PROT, ALBUMIN in the last 168 hours. No results for input(s): LIPASE, AMYLASE in the last 168 hours. No results for input(s): AMMONIA in the last 168 hours. Coagulation Profile: No results for input(s): INR, PROTIME in the last 168 hours. Cardiac Enzymes: No results for input(s): CKTOTAL, CKMB, CKMBINDEX, TROPONINI in the last 168 hours. BNP  (last 3 results) No results for input(s): PROBNP in the last 8760 hours. HbA1C: No results for input(s): HGBA1C in the last 72 hours. CBG: No results for input(s): GLUCAP in the last 168 hours. Lipid Profile: No results for input(s): CHOL, HDL, LDLCALC, TRIG, CHOLHDL, LDLDIRECT in the last 72 hours. Thyroid Function Tests: No results for input(s): TSH, T4TOTAL, FREET4, T3FREE, THYROIDAB in the last 72 hours. Anemia Panel: No results for input(s): VITAMINB12, FOLATE, FERRITIN, TIBC, IRON, RETICCTPCT in the last 72 hours.  --------------------------------------------------------------------------------------------------------------- Urine analysis:    Component Value Date/Time   COLORURINE YELLOW 10/02/2019 1149   APPEARANCEUR HAZY (A) 10/02/2019 1149   LABSPEC 1.016 10/02/2019 1149   PHURINE 7.0 10/02/2019 1149   GLUCOSEU NEGATIVE 10/02/2019 1149   HGBUR MODERATE (A) 10/02/2019 1149   BILIRUBINUR NEGATIVE 10/02/2019 1149   KETONESUR 5 (A) 10/02/2019 1149   PROTEINUR 100 (A) 10/02/2019 1149   NITRITE POSITIVE (A) 10/02/2019 1149   LEUKOCYTESUR NEGATIVE 10/02/2019 1149      Imaging Results:    CT Head Wo Contrast  Result Date: 10/02/2019 CLINICAL DATA:  Recent fall EXAM: CT HEAD WITHOUT CONTRAST CT CERVICAL SPINE WITHOUT CONTRAST TECHNIQUE: Multidetector CT imaging of the head and cervical spine was performed following the standard protocol without intravenous contrast. Multiplanar CT image reconstructions of the  cervical spine were also generated. COMPARISON:  08/18/2019 FINDINGS: CT HEAD FINDINGS Brain: Mild atrophic changes and chronic white matter ischemic changes are noted. No findings to suggest acute hemorrhage, acute infarction or space-occupying mass lesion are seen. Vascular: No hyperdense vessel or unexpected calcification. Skull: Normal. Negative for fracture or focal lesion. Sinuses/Orbits: No acute finding. Other: None. CT CERVICAL SPINE FINDINGS Alignment: Mild  anterolisthesis of C4 on C5 of a degenerative nature is noted. Anterolisthesis of C7 on T1 is noted also of a degenerative nature. Skull base and vertebrae: 7 cervical segments are well visualized. Vertebral body height is well maintained. The odontoid is within normal limits. Disc space narrowing is seen from C5-C7. Mild associated osteophytic changes are noted. Multilevel facet hypertrophic changes are seen. No acute fracture is noted. Soft tissues and spinal canal: Surrounding soft tissue structures appear within normal limits. Upper chest: Visualized lung apices are unremarkable. Other: None IMPRESSION: CT of the head: Chronic atrophic and ischemic changes without acute abnormality. CT of the cervical spine: Multilevel degenerative change with mild anterolisthesis. No acute abnormality noted. Electronically Signed   By: Inez Catalina M.D.   On: 10/02/2019 12:08   CT Cervical Spine Wo Contrast  Result Date: 10/02/2019 CLINICAL DATA:  Recent fall EXAM: CT HEAD WITHOUT CONTRAST CT CERVICAL SPINE WITHOUT CONTRAST TECHNIQUE: Multidetector CT imaging of the head and cervical spine was performed following the standard protocol without intravenous contrast. Multiplanar CT image reconstructions of the cervical spine were also generated. COMPARISON:  08/18/2019 FINDINGS: CT HEAD FINDINGS Brain: Mild atrophic changes and chronic white matter ischemic changes are noted. No findings to suggest acute hemorrhage, acute infarction or space-occupying mass lesion are seen. Vascular: No hyperdense vessel or unexpected calcification. Skull: Normal. Negative for fracture or focal lesion. Sinuses/Orbits: No acute finding. Other: None. CT CERVICAL SPINE FINDINGS Alignment: Mild anterolisthesis of C4 on C5 of a degenerative nature is noted. Anterolisthesis of C7 on T1 is noted also of a degenerative nature. Skull base and vertebrae: 7 cervical segments are well visualized. Vertebral body height is well maintained. The odontoid is  within normal limits. Disc space narrowing is seen from C5-C7. Mild associated osteophytic changes are noted. Multilevel facet hypertrophic changes are seen. No acute fracture is noted. Soft tissues and spinal canal: Surrounding soft tissue structures appear within normal limits. Upper chest: Visualized lung apices are unremarkable. Other: None IMPRESSION: CT of the head: Chronic atrophic and ischemic changes without acute abnormality. CT of the cervical spine: Multilevel degenerative change with mild anterolisthesis. No acute abnormality noted. Electronically Signed   By: Inez Catalina M.D.   On: 10/02/2019 12:08   DG Pelvis Portable  Result Date: 10/02/2019 CLINICAL DATA:  Recent fall with hip pain, initial encounter EXAM: PORTABLE PELVIS 1 VIEWS COMPARISON:  None. FINDINGS: Pelvic ring is intact. No acute fracture is noted. No soft tissue abnormality is seen. IMPRESSION: No acute abnormality noted. Electronically Signed   By: Inez Catalina M.D.   On: 10/02/2019 11:47   DG Chest Portable 1 View  Result Date: 10/02/2019 CLINICAL DATA:  Fall EXAM: PORTABLE CHEST 1 VIEW COMPARISON:  08/18/2019 FINDINGS: The heart size and mediastinal contours are within normal limits. Both lungs are clear. No pleural effusion or pneumothorax. The visualized skeletal structures are grossly intact. Advanced degenerative changes at the left glenohumeral joint. IMPRESSION: No acute process in the chest. Electronically Signed   By: Macy Mis M.D.   On: 10/02/2019 11:46       Assessment & Plan:  Active Problems:   AMS (altered mental status)   1. Altered mental status likely from UTI-patient already started on IV ceftriaxone.   2. UTI-patient started on IV ceftriaxone as above.  Follow urine culture results 3. Dementia-patient has a Summers dementia, she is not taking her medications at home as per home health aide.  Would not continue medication at this time. 4. Hypertension-start hydralazine 25 mg p.o. every 6  hours as needed for BP greater than 160/100.    DVT Prophylaxis-   Lovenox   AM Labs Ordered, also please review Full Orders  Family Communication: spoke to son Mariame Durrette at bedside, confirmed DNR  Code Status: DNR  Admission status:Inpatient :The appropriate admission status for this patient is INPATIENT. Inpatient status is judged to be reasonable and necessary in order to provide the required intensity of service to ensure the patient's safety. The patient's presenting symptoms, physical exam findings, and initial radiographic and laboratory data in the context of their chronic comorbidities is felt to place them at high risk for further clinical deterioration. Furthermore, it is not anticipated that the patient will be medically stable for discharge from the hospital within 2 midnights of admission. The following factors support the admission status of inpatient.     The patient's presenting symptoms include altered mental status The worrisome physical exam findings include altered mental status. The initial radiographic and laboratory data are worrisome because of UTI. The chronic co-morbidities include dementia.       * I certify that at the point of admission it is my clinical judgment that the patient will require inpatient hospital care spanning beyond 2 midnights from the point of admission due to high intensity of service, high risk for further deterioration and high frequency of surveillance required.*  Time spent in minutes : 60 minutes   Jeanmarie Mccowen S Yurem Viner M.D

## 2019-10-02 NOTE — ED Notes (Signed)
Attempted report.  Per unit, bed is not approved yet, RN unaware she is getting pt.

## 2019-10-02 NOTE — ED Notes (Signed)
Caregiver at bedside

## 2019-10-03 ENCOUNTER — Other Ambulatory Visit: Payer: Self-pay

## 2019-10-03 DIAGNOSIS — I10 Essential (primary) hypertension: Secondary | ICD-10-CM

## 2019-10-03 DIAGNOSIS — G9341 Metabolic encephalopathy: Secondary | ICD-10-CM

## 2019-10-03 DIAGNOSIS — N390 Urinary tract infection, site not specified: Secondary | ICD-10-CM

## 2019-10-03 DIAGNOSIS — G301 Alzheimer's disease with late onset: Secondary | ICD-10-CM

## 2019-10-03 DIAGNOSIS — F0280 Dementia in other diseases classified elsewhere without behavioral disturbance: Secondary | ICD-10-CM

## 2019-10-03 LAB — COMPREHENSIVE METABOLIC PANEL
ALT: 16 U/L (ref 0–44)
AST: 25 U/L (ref 15–41)
Albumin: 3.5 g/dL (ref 3.5–5.0)
Alkaline Phosphatase: 71 U/L (ref 38–126)
Anion gap: 9 (ref 5–15)
BUN: 17 mg/dL (ref 8–23)
CO2: 23 mmol/L (ref 22–32)
Calcium: 8.6 mg/dL — ABNORMAL LOW (ref 8.9–10.3)
Chloride: 106 mmol/L (ref 98–111)
Creatinine, Ser: 1 mg/dL (ref 0.44–1.00)
GFR calc Af Amer: 57 mL/min — ABNORMAL LOW (ref >60)
GFR calc non Af Amer: 49 mL/min — ABNORMAL LOW (ref >60)
Glucose, Bld: 124 mg/dL — ABNORMAL HIGH (ref 70–99)
Potassium: 3.8 mmol/L (ref 3.5–5.1)
Sodium: 138 mmol/L (ref 135–145)
Total Bilirubin: 0.9 mg/dL (ref 0.3–1.2)
Total Protein: 6.3 g/dL — ABNORMAL LOW (ref 6.5–8.1)

## 2019-10-03 LAB — CBC
HCT: 47.7 % — ABNORMAL HIGH (ref 36.0–46.0)
Hemoglobin: 15.9 g/dL — ABNORMAL HIGH (ref 12.0–15.0)
MCH: 30.7 pg (ref 26.0–34.0)
MCHC: 33.3 g/dL (ref 30.0–36.0)
MCV: 92.1 fL (ref 80.0–100.0)
Platelets: 169 10*3/uL (ref 150–400)
RBC: 5.18 MIL/uL — ABNORMAL HIGH (ref 3.87–5.11)
RDW: 13.5 % (ref 11.5–15.5)
WBC: 8.8 10*3/uL (ref 4.0–10.5)
nRBC: 0 % (ref 0.0–0.2)

## 2019-10-03 MED ORDER — ACETAMINOPHEN 500 MG PO TABS
500.0000 mg | ORAL_TABLET | Freq: Two times a day (BID) | ORAL | Status: DC
  Administered 2019-10-03 – 2019-10-09 (×12): 500 mg via ORAL
  Filled 2019-10-03 (×12): qty 1

## 2019-10-03 NOTE — Evaluation (Signed)
Physical Therapy Evaluation Patient Details Name: KIERSTON WAIR MRN: [id number] DOB: May 21, 1925 Today's Date: 10/03/2019   History of Present Illness  Kenedi Lattin  is a 86 y.o. female, with history of dementia, had unwitnessed fall at home , confused and unable tio converse. In the ED 10/02/19 patient was found to have abnormal UA,  Imaging studies including CT head and CT cervical spine was negative.  Chest x-ray was unremarkable.  Pelvic x-ray was unremarkable. At baseline patient uses walker and a cane.  Home health aides take care of the patient but son also lives on property and helps take care of her.  Clinical Impression  The patient is very pleasant, requires extra time to initiate ACTIVITY, MULTIMODAL CUES to stand, keep progression with ambulation. Frequently stops and asks about the IV beeping. The patient currently will require 24/7 assistance due to decreased balance and mentation. Pt admitted with above diagnosis.  Pt currently with functional limitations due to the deficits listed below (see PT Problem List). Pt will benefit from skilled PT to increase their independence and safety with mobility to allow discharge to the venue listed below.       Follow Up Recommendations Home health PT; if has 24/7 caregivers. OR SNF;  Equipment Recommendations  None recommended by PT    Recommendations for Other Services   ot   Precautions / Restrictions Precautions Precautions: Fall      Mobility  Bed Mobility Overal bed mobility: Needs Assistance Bed Mobility: Sit to Supine       Sit to supine: Min assist   General bed mobility comments: patient performed 755 of getting back into bed, assisted with getting body straight  Transfers Overall transfer level: Needs assistance   Transfers: Sit to/from Stand Sit to Stand: Mod assist         General transfer comment: assist to power up from recliner and toilet, multimodal cues on where to hole and pull on  rail  Ambulation/Gait Ambulation/Gait assistance: Min assist;Mod assist Gait Distance (Feet): 20 Feet(x 2) Assistive device: Rolling walker (2 wheeled) Gait Pattern/deviations: Step-to pattern;Step-through pattern;Drifts right/left;Festinating Gait velocity: decr Gait velocity interpretation: <1.8 ft/sec, indicate of risk for recurrent falls General Gait Details: multimodal cues to keep progression while ambulating  Stairs            Wheelchair Mobility    Modified Rankin (Stroke Patients Only)       Balance Overall balance assessment: Needs assistance;History of Falls Sitting-balance support: Feet supported;Single extremity supported Sitting balance-Leahy Scale: Fair     Standing balance support: During functional activity;Bilateral upper extremity supported Standing balance-Leahy Scale: Poor Standing balance comment: requires external support, steady assist to balance for pericare while standing                             Pertinent Vitals/Pain Pain Assessment: Faces Faces Pain Scale: Hurts even more Pain Location: left shoukder Pain Descriptors / Indicators: Discomfort;Grimacing Pain Intervention(s): Monitored during session;Limited activity within patient's tolerance;Repositioned    Home Living Family/patient expects to be discharged to:: Private residence   Available Help at Discharge: Family;Personal care attendant           Home Equipment: Gilford Rile - 2 wheels;Cane - single point Additional Comments: pt. unable to provide info re: home. chart indocates has caregivers but not 24/7.    Prior Function Level of Independence: Needs assistance   Gait / Transfers Assistance Needed: uses a cane or RW  ADL's / Homemaking Assistance Needed: unsure        Hand Dominance        Extremity/Trunk Assessment   Upper Extremity Assessment Upper Extremity Assessment: Generalized weakness    Lower Extremity Assessment Lower Extremity Assessment:  Generalized weakness    Cervical / Trunk Assessment Cervical / Trunk Assessment: Kyphotic  Communication      Cognition Arousal/Alertness: Awake/alert Behavior During Therapy: WFL for tasks assessed/performed Overall Cognitive Status: No family/caregiver present to determine baseline cognitive functioning Area of Impairment: Orientation;Following commands;Awareness;Problem solving                 Orientation Level: Time;Situation     Following Commands: Follows one step commands with increased time   Awareness: Intellectual Problem Solving: Slow processing;Decreased initiation;Difficulty sequencing General Comments: oriented to [place], multomodal cues and reminders to keep moving, stops and asks what the beeping noise is, multiple times( IV)      General Comments      Exercises     Assessment/Plan    PT Assessment Patient needs continued PT services  PT Problem List Decreased activity tolerance;Decreased range of motion;Decreased mobility;Decreased balance;Decreased cognition;Decreased knowledge of use of DME;Pain;Decreased safety awareness       PT Treatment Interventions DME instruction;Therapeutic activities;Gait training;Therapeutic exercise;Patient/family education;Functional mobility training;Balance training    PT Goals (Current goals can be found in the Care Plan section)  Acute Rehab PT Goals Patient Stated Goal: agreed to amb to BR with encouragement PT Goal Formulation: Patient unable to participate in goal setting Time For Goal Achievement: 10/17/19 Potential to Achieve Goals: Fair    Frequency Min 3X/week   Barriers to discharge Decreased caregiver support will require 24/7    Co-evaluation               AM-PAC PT "6 Clicks" Mobility  Outcome Measure Help needed turning from your back to your side while in a flat bed without using bedrails?: A Little Help needed moving from lying on your back to sitting on the side of a flat bed  without using bedrails?: A Little Help needed moving to and from a bed to a chair (including a wheelchair)?: A Lot Help needed standing up from a chair using your arms (e.g., wheelchair or bedside chair)?: A Lot Help needed to walk in hospital room?: A Lot Help needed climbing 3-5 steps with a railing? : A Lot 6 Click Score: 14    End of Session Equipment Utilized During Treatment: Gait belt Activity Tolerance: Patient tolerated treatment well Patient left: in bed;with call bell/phone within reach;with chair alarm set Nurse Communication: Mobility status PT Visit Diagnosis: Unsteadiness on feet (R26.81);Difficulty in walking, not elsewhere classified (R26.2);Pain    Time: 1430-1510 PT Time Calculation (min) (ACUTE ONLY): 40 min   Charges:   PT Evaluation $PT Eval Moderate Complexity: 1 Mod PT Treatments $Gait Training: 8-22 mins $Self Care/Home Management: [place] Pager [phone] Office [phone]   Claretha Cooper 10/03/2019, 3:27 PM

## 2019-10-03 NOTE — H&P (Signed)
PROGRESS NOTE    Alicia Harrison  [id number]  DOB: [birth year]  PCP: Haywood Pao, MD Admit date:10/02/2019 86 y.o. female, with history of dementia who uses walker and a cane at baseline, brought in by son after an unwitnessed fall / found on the floor with AMS.  Usually she is able to carry out a conversation but has not been able to talk.EMS was called and patient brought to the ED for further evaluation.Home health aides take care of the patient but son also lives on property and helps take care of her ED Course: Afebrile, BP elevated at systolic 123456 to [id number], diastolic in 123XX123, WBC [id number], renal function okay, \ found to have abnormal UA, started on IV ceftriaxone.  Imaging studies including CT head and CT cervical spine was negative.  Chest x-ray was unremarkable.  Pelvic x-ray was unremarkable. Hospital course: Patient admitted to [facility] for UTI/metabolic encephalopathy with empiric IV abx. S/P1 lit IVF    Subjective: Seen sitting in bedside chair.  Communicative but disoriented.  Oriented to person only.  Objective: Vitals:   10/02/19 1817 10/02/19 1855 10/02/19 2135 10/03/19 0451  BP: (!) 153/73 (!) 156/98 (!) 166/84 (!) 166/88  Pulse: 77 87 83 83  Resp: 17 14 18 16   Temp:  98.7 F (37.1 C) 98.8 F (37.1 C) 98.6 F (37 C)  TempSrc:  Oral Oral Oral  SpO2: 99% 99% 100% 97%  Weight:    61.7 kg  Height:    5\' 7"  (1.702 m)    Intake/Output Summary (Last 24 hours) at 10/03/2019 0918 Last data filed at 10/02/2019 1757 Gross per 24 hour  Intake 464.54 ml  Output --  Net 464.54 ml   Filed Weights   10/03/19 0451  Weight: 61.7 kg    Physical Examination:  General exam: Appears calm and comfortable  Respiratory system: Clear to auscultation. Respiratory effort normal. Cardiovascular system: S1 & S2 heard, RRR. No JVD, murmurs, rubs, gallops or clicks. No pedal edema. Gastrointestinal system: Abdomen is nondistended, soft and nontender. Normal bowel sounds  heard. Central nervous system: Alert and oriented. No new focal neurological deficits. Extremities: No contractures, edema or joint deformities.  Skin: No rashes, lesions or ulcers Psychiatry: Judgement and insight appear impaired. Mood & affect appropriate.   Data Reviewed: I have personally reviewed following labs and imaging studies  CBC: Recent Labs  Lab 10/02/19 1228 10/03/19 0436  WBC 11.0* 8.8  NEUTROABS 9.6*  --   HGB 13.0 15.9*  HCT 39.5 47.7*  MCV 93.8 92.1  PLT 243 123XX123   Basic Metabolic Panel: Recent Labs  Lab 10/02/19 1228 10/03/19 0436  NA 139 138  K 4.4 3.8  CL 103 106  CO2 26 23  GLUCOSE 129* 124*  BUN 26* 17  CREATININE 1.18* 1.00  CALCIUM 9.3 8.6*   GFR: Estimated Creatinine Clearance: 35.6 mL/min (by C-G formula based on SCr of 1 mg/dL). Liver Function Tests: Recent Labs  Lab 10/03/19 0436  AST 25  ALT 16  ALKPHOS 71  BILITOT 0.9  PROT 6.3*  ALBUMIN 3.5   No results for input(s): LIPASE, AMYLASE in the last 168 hours. No results for input(s): AMMONIA in the last 168 hours. Coagulation Profile: No results for input(s): INR, PROTIME in the last 168 hours. Cardiac Enzymes: No results for input(s): CKTOTAL, CKMB, CKMBINDEX, TROPONINI in the last 168 hours. BNP (last 3 results) No results for input(s): PROBNP in the last 8760 hours. HbA1C: No results for input(s): HGBA1C in  the last 72 hours. CBG: No results for input(s): GLUCAP in the last 168 hours. Lipid Profile: No results for input(s): CHOL, HDL, LDLCALC, TRIG, CHOLHDL, LDLDIRECT in the last 72 hours. Thyroid Function Tests: No results for input(s): TSH, T4TOTAL, FREET4, T3FREE, THYROIDAB in the last 72 hours. Anemia Panel: No results for input(s): VITAMINB12, FOLATE, FERRITIN, TIBC, IRON, RETICCTPCT in the last 72 hours. Sepsis Labs: No results for input(s): PROCALCITON, LATICACIDVEN in the last 168 hours.  Recent Results (from the past 240 hour(s))  SARS Coronavirus 2 by RT PCR  (hospital order, performed in [facility] hospital lab) Nasopharyngeal Nasopharyngeal Swab     Status: None   Collection Time: 10/02/19  1:09 PM   Specimen: Nasopharyngeal Swab  Result Value Ref Range Status   SARS Coronavirus 2 NEGATIVE NEGATIVE Final    Comment: (NOTE) SARS-CoV-2 target nucleic acids are NOT DETECTED. The SARS-CoV-2 RNA is generally detectable in upper and lower respiratory specimens during the acute phase of infection. The lowest concentration of SARS-CoV-2 viral copies this assay can detect is 250 copies / mL. A negative result does not preclude SARS-CoV-2 infection and should not be used as the sole basis for treatment or other patient management decisions.  A negative result may occur with improper specimen collection / handling, submission of specimen other than nasopharyngeal swab, presence of viral mutation(s) within the areas targeted by this assay, and inadequate number of viral copies (<250 copies / mL). A negative result must be combined with clinical observations, patient history, and epidemiological information. Fact Sheet for Patients:   StrictlyIdeas.no Fact Sheet for Healthcare Providers: BankingDealers.co.za This test is not yet approved or cleared  by the [place] FDA and has been authorized for detection and/or diagnosis of SARS-CoV-2 by FDA under an Emergency Use Authorization (EUA).  This EUA will remain in effect (meaning this test can be used) for the duration of the COVID-19 declaration under Section 564(b)(1) of the Act, 21 U.S.C. section 360bbb-3(b)(1), unless the authorization is terminated or revoked sooner. Performed at [facility], [place] [address] [place], [place] [address]       Radiology Studies: CT Head Wo Contrast  Result Date: 10/02/2019 CLINICAL DATA:  Recent fall EXAM: CT HEAD WITHOUT CONTRAST CT CERVICAL SPINE WITHOUT CONTRAST TECHNIQUE: Multidetector  CT imaging of the head and cervical spine was performed following the standard protocol without intravenous contrast. Multiplanar CT image reconstructions of the cervical spine were also generated. COMPARISON:  08/18/2019 FINDINGS: CT HEAD FINDINGS Brain: Mild atrophic changes and chronic white matter ischemic changes are noted. No findings to suggest acute hemorrhage, acute infarction or space-occupying mass lesion are seen. Vascular: No hyperdense vessel or unexpected calcification. Skull: Normal. Negative for fracture or focal lesion. Sinuses/Orbits: No acute finding. Other: None. CT CERVICAL SPINE FINDINGS Alignment: Mild anterolisthesis of C4 on C5 of a degenerative nature is noted. Anterolisthesis of C7 on T1 is noted also of a degenerative nature. Skull base and vertebrae: 7 cervical segments are well visualized. Vertebral body height is well maintained. The odontoid is within normal limits. Disc space narrowing is seen from C5-C7. Mild associated osteophytic changes are noted. Multilevel facet hypertrophic changes are seen. No acute fracture is noted. Soft tissues and spinal canal: Surrounding soft tissue structures appear within normal limits. Upper chest: Visualized lung apices are unremarkable. Other: None IMPRESSION: CT of the head: Chronic atrophic and ischemic changes without acute abnormality. CT of the cervical spine: Multilevel degenerative change with mild anterolisthesis. No acute abnormality noted.  Electronically Signed   By: Inez Catalina M.D.   On: 10/02/2019 12:08   CT Cervical Spine Wo Contrast  Result Date: 10/02/2019 CLINICAL DATA:  Recent fall EXAM: CT HEAD WITHOUT CONTRAST CT CERVICAL SPINE WITHOUT CONTRAST TECHNIQUE: Multidetector CT imaging of the head and cervical spine was performed following the standard protocol without intravenous contrast. Multiplanar CT image reconstructions of the cervical spine were also generated. COMPARISON:  08/18/2019 FINDINGS: CT HEAD FINDINGS Brain:  Mild atrophic changes and chronic white matter ischemic changes are noted. No findings to suggest acute hemorrhage, acute infarction or space-occupying mass lesion are seen. Vascular: No hyperdense vessel or unexpected calcification. Skull: Normal. Negative for fracture or focal lesion. Sinuses/Orbits: No acute finding. Other: None. CT CERVICAL SPINE FINDINGS Alignment: Mild anterolisthesis of C4 on C5 of a degenerative nature is noted. Anterolisthesis of C7 on T1 is noted also of a degenerative nature. Skull base and vertebrae: 7 cervical segments are well visualized. Vertebral body height is well maintained. The odontoid is within normal limits. Disc space narrowing is seen from C5-C7. Mild associated osteophytic changes are noted. Multilevel facet hypertrophic changes are seen. No acute fracture is noted. Soft tissues and spinal canal: Surrounding soft tissue structures appear within normal limits. Upper chest: Visualized lung apices are unremarkable. Other: None IMPRESSION: CT of the head: Chronic atrophic and ischemic changes without acute abnormality. CT of the cervical spine: Multilevel degenerative change with mild anterolisthesis. No acute abnormality noted. Electronically Signed   By: Inez Catalina M.D.   On: 10/02/2019 12:08   DG Pelvis Portable  Result Date: 10/02/2019 CLINICAL DATA:  Recent fall with hip pain, initial encounter EXAM: PORTABLE PELVIS 1 VIEWS COMPARISON:  None. FINDINGS: Pelvic ring is intact. No acute fracture is noted. No soft tissue abnormality is seen. IMPRESSION: No acute abnormality noted. Electronically Signed   By: Inez Catalina M.D.   On: 10/02/2019 11:47   DG Chest Portable 1 View  Result Date: 10/02/2019 CLINICAL DATA:  Fall EXAM: PORTABLE CHEST 1 VIEW COMPARISON:  08/18/2019 FINDINGS: The heart size and mediastinal contours are within normal limits. Both lungs are clear. No pleural effusion or pneumothorax. The visualized skeletal structures are grossly intact. Advanced  degenerative changes at the left glenohumeral joint. IMPRESSION: No acute process in the chest. Electronically Signed   By: Macy Mis M.D.   On: 10/02/2019 11:46        Scheduled Meds: . amLODipine  5 mg Oral Daily  . enoxaparin (LOVENOX) injection  40 mg Subcutaneous Q24H   Continuous Infusions: . cefTRIAXone (ROCEPHIN)  IV    . dextrose 5 % and 0.9% NaCl 100 mL/hr at 10/02/19 1902     Assessment/Plan:  1. UTI with acute metabolic encephalopathy: Continue IV abx. Leucocytosis resolved.   2. HTN : ?new diagnosis. Not on any home meds. Started on Norvasc 5 mg daily , hydralazine prn. Address pain.   3.  Fall: mechanical vs UTI related. Trauma w/u and stroke w/u in ED unremarkable  4. Dementia: not been taking home meds (aricept/namenda)   DVT prophylaxis: lovenox Code Status: DNR Family / Patient Communication:  Disposition Plan:   Status is: Inpatient  Remains inpatient appropriate because:Inpatient level of care appropriate due to severity of illness   Dispo: The patient is from: Home              Anticipated d/c is to: TBD based on PT eval/family discussion  Anticipated d/c date is: 2 days              Patient currently is not medically stable to d/c.      LOS: 1 day    Time spent:     Guilford Shi, MD [facility] Pager in [place]  If 7PM-7AM, please contact night-coverage [email or web address] 10/03/2019, 9:18 AM

## 2019-10-04 LAB — URINE CULTURE: Culture: >=100000 — AB

## 2019-10-04 MED ORDER — AMLODIPINE BESYLATE 10 MG PO TABS
10.0000 mg | ORAL_TABLET | Freq: Every day | ORAL | Status: DC
  Administered 2019-10-04 – 2019-10-09 (×6): 10 mg via ORAL
  Filled 2019-10-04 (×6): qty 1

## 2019-10-04 NOTE — H&P (Addendum)
PROGRESS NOTE    Alicia Harrison  [id number]  DOB: 1925-10-06  PCP: Haywood Pao, MD Admit date:10/02/2019 86 y.o. female, with history of dementia who uses walker and a cane at baseline, brought in by son after an unwitnessed fall / found on the floor with AMS.  Usually she is able to carry out a conversation but has not been able to talk.EMS was called and patient brought to the ED for further evaluation.Home health aides take care of the patient but son also lives on property and helps take care of her ED Course: Afebrile, BP elevated at systolic 123456 to [id number], diastolic in 123XX123, WBC [id number], renal function okay, \ found to have abnormal UA, started on IV ceftriaxone.  Imaging studies including CT head and CT cervical spine was negative.  Chest x-ray was unremarkable.  Pelvic x-ray was unremarkable. Hospital course: Patient admitted to [facility] for UTI/metabolic encephalopathy with empiric IV abx. S/P1 lit IVF    Subjective: Seen sitting in bedside chair.  Appears more alert and communicative today.  States her son usually is at work and unable to get to phone.   Objective: Vitals:   10/03/19 2058 10/04/19 0504 10/04/19 1223 10/04/19 1355  BP: (!) 144/76 (!) 164/88 (!) 147/81 (!) 142/70  Pulse: 88 86 85 94  Resp: 16 20  16   Temp: 99 F (37.2 C) 99.8 F (37.7 C)  97.8 F (36.6 C)  TempSrc: Oral Oral  Oral  SpO2: 96% 96%  96%  Weight:      Height:        Intake/Output Summary (Last 24 hours) at 10/04/2019 1729 Last data filed at 10/04/2019 1357 Gross per 24 hour  Intake 1526.87 ml  Output --  Net 1526.87 ml   Filed Weights   10/03/19 0451  Weight: 61.7 kg    Physical Examination:  General exam: Appears calm and comfortable  Respiratory system: Clear to auscultation. Respiratory effort normal. Cardiovascular system: S1 & S2 heard, RRR. No JVD, murmurs, rubs, gallops or clicks. No pedal edema. Gastrointestinal system: Abdomen is nondistended, soft and nontender.  Normal bowel sounds heard. Central nervous system: Alert and oriented. No new focal neurological deficits. Extremities: No contractures, edema or joint deformities.  Skin: No rashes, lesions or ulcers Psychiatry: Judgement and insight appear impaired. Mood & affect appropriate.   Data Reviewed: I have personally reviewed following labs and imaging studies  CBC: Recent Labs  Lab 10/02/19 1228 10/03/19 0436  WBC 11.0* 8.8  NEUTROABS 9.6*  --   HGB 13.0 15.9*  HCT 39.5 47.7*  MCV 93.8 92.1  PLT 243 123XX123   Basic Metabolic Panel: Recent Labs  Lab 10/02/19 1228 10/03/19 0436  NA 139 138  K 4.4 3.8  CL 103 106  CO2 26 23  GLUCOSE 129* 124*  BUN 26* 17  CREATININE 1.18* 1.00  CALCIUM 9.3 8.6*   GFR: Estimated Creatinine Clearance: 35.6 mL/min (by C-G formula based on SCr of 1 mg/dL). Liver Function Tests: Recent Labs  Lab 10/03/19 0436  AST 25  ALT 16  ALKPHOS 71  BILITOT 0.9  PROT 6.3*  ALBUMIN 3.5   No results for input(s): LIPASE, AMYLASE in the last 168 hours. No results for input(s): AMMONIA in the last 168 hours. Coagulation Profile: No results for input(s): INR, PROTIME in the last 168 hours. Cardiac Enzymes: No results for input(s): CKTOTAL, CKMB, CKMBINDEX, TROPONINI in the last 168 hours. BNP (last 3 results) No results for input(s): PROBNP in the last  8760 hours. HbA1C: No results for input(s): HGBA1C in the last 72 hours. CBG: No results for input(s): GLUCAP in the last 168 hours. Lipid Profile: No results for input(s): CHOL, HDL, LDLCALC, TRIG, CHOLHDL, LDLDIRECT in the last 72 hours. Thyroid Function Tests: No results for input(s): TSH, T4TOTAL, FREET4, T3FREE, THYROIDAB in the last 72 hours. Anemia Panel: No results for input(s): VITAMINB12, FOLATE, FERRITIN, TIBC, IRON, RETICCTPCT in the last 72 hours. Sepsis Labs: No results for input(s): PROCALCITON, LATICACIDVEN in the last 168 hours.  Recent Results (from the past 240 hour(s))  Urine  culture     Status: Abnormal   Collection Time: 10/02/19 11:49 AM   Specimen: Urine, Clean Catch  Result Value Ref Range Status   Specimen Description   Final    URINE, CLEAN CATCH Performed at [facility], [place] [address] [place], [place] [address]    Special Requests   Final    NONE Performed at [facility], [place] [address] [place], [place] [address]    Culture >=100,000 COLONIES/mL ESCHERICHIA COLI (A)  Final   Report Status 10/04/2019 FINAL  Final   Organism ID, Bacteria ESCHERICHIA COLI (A)  Final      Susceptibility   Escherichia coli - MIC*    AMPICILLIN 4 SENSITIVE Sensitive     CEFAZOLIN <=4 SENSITIVE Sensitive     CEFTRIAXONE <=1 SENSITIVE Sensitive     CIPROFLOXACIN <=0.25 SENSITIVE Sensitive     GENTAMICIN <=1 SENSITIVE Sensitive     IMIPENEM <=0.25 SENSITIVE Sensitive     NITROFURANTOIN <=16 SENSITIVE Sensitive     TRIMETH/SULFA <=20 SENSITIVE Sensitive     AMPICILLIN/SULBACTAM <=2 SENSITIVE Sensitive     PIP/TAZO <=4 SENSITIVE Sensitive     * >=100,000 COLONIES/mL ESCHERICHIA COLI  SARS Coronavirus 2 by RT PCR (hospital order, performed in [place] hospital lab) Nasopharyngeal Nasopharyngeal Swab     Status: None   Collection Time: 10/02/19  1:09 PM   Specimen: Nasopharyngeal Swab  Result Value Ref Range Status   SARS Coronavirus 2 NEGATIVE NEGATIVE Final    Comment: (NOTE) SARS-CoV-2 target nucleic acids are NOT DETECTED. The SARS-CoV-2 RNA is generally detectable in upper and lower respiratory specimens during the acute phase of infection. The lowest concentration of SARS-CoV-2 viral copies this assay can detect is 250 copies / mL. A negative result does not preclude SARS-CoV-2 infection and should not be used as the sole basis for treatment or other patient management decisions.  A negative result may occur with improper specimen collection / handling, submission of specimen other than nasopharyngeal swab, presence  of viral mutation(s) within the areas targeted by this assay, and inadequate number of viral copies (<250 copies / mL). A negative result must be combined with clinical observations, patient history, and epidemiological information. Fact Sheet for Patients:   StrictlyIdeas.no Fact Sheet for Healthcare Providers: BankingDealers.co.za This test is not yet approved or cleared  by the [place] FDA and has been authorized for detection and/or diagnosis of SARS-CoV-2 by FDA under an Emergency Use Authorization (EUA).  This EUA will remain in effect (meaning this test can be used) for the duration of the COVID-19 declaration under Section 564(b)(1) of the Act, 21 U.S.C. section 360bbb-3(b)(1), unless the authorization is terminated or revoked sooner. Performed at [facility], [place] [address], [place], [place] [address]       Radiology Studies: No results found.      Scheduled Meds: . acetaminophen  500 mg  Oral BID  . amLODipine  10 mg Oral Daily  . enoxaparin (LOVENOX) injection  40 mg Subcutaneous Q24H   Continuous Infusions: . cefTRIAXone (ROCEPHIN)  IV 1 g (10/04/19 1419)  . dextrose 5 % and 0.9% NaCl 100 mL/hr at 10/04/19 0816     Assessment/Plan:  1. E. coli UTI with acute metabolic encephalopathy: Continue IV abx-day 3.  Urine culture growing pansensitive E. coli.  Leucocytosis resolved.  Plan to discharge in a.m. with oral antibiotics.  Mental status now appears close to baseline.  2. HTN : ?new diagnosis. Not on any home meds. Started on Norvasc 5 mg daily -increased to 10 mg today as SBP still elevated in systolic 123456., hydralazine prn. Address pain although looks comfortable..   3.  Fall: mechanical vs UTI related. Trauma w/u and stroke w/u in ED unremarkable  4. Dementia: not been taking home meds (aricept/namenda), resume  DVT prophylaxis: lovenox Code Status: DNR Family / Patient  Communication: Called son twice this morning (both at work and home numbers) will try calling again.  PT recommended home with 24-hour supervision versus SNF.  Will check with family if able to provide 24-hour supervision. Addendum: Son returned my call and stated will be here tomorrow morning around 8 AM to discuss with Education officer, [employer].  He states patient lives alone, has private caregivers coming in for 3 hours in the morning and 3 hours in the evening.  She has fallen almost once every week.  He intends to see if he can stretch paid caregiver hours by talking to the company in the morning.  If he is unable to arrange 24-hour, he may want SNF placement. Disposition Plan:   Status is: Inpatient  Remains inpatient appropriate because:Inpatient level of care appropriate due to severity of illness   Dispo: The patient is from: Home              Anticipated d/c is to: TBD based on PT eval/family discussion              Anticipated d/c date is: 2 days              Patient currently is not medically stable to d/c.      LOS: 2 days    Time spent: 25 minutes    Guilford Shi, MD [facility] Pager in [place]  If 7PM-7AM, please contact night-coverage [email or web address] 10/04/2019, 5:29 PM

## 2019-10-04 NOTE — Evaluation (Signed)
Occupational Therapy Evaluation Patient Details Name: Alicia Harrison MRN: [id number] DOB: 02-Dec-1925 Today's Date: 10/04/2019    History of Present Illness Alicia Harrison  is a 86 y.o. female, with history of dementia, had unwitnessed fall at home , confused and unable tio converse. In the ED 10/02/19 patient was found to have abnormal UA,  Imaging studies including CT head and CT cervical spine was negative.  Chest x-ray was unremarkable.  Pelvic x-ray was unremarkable. At baseline patient uses walker and a cane.  Home health aides take care of the patient but son also lives on property and helps take care of her.   Clinical Impression   Pt unreliable historian, so unsure of true PLOF. Today Pt is min A for bed mobility, mod A for mobility with RW (initially face to face SPT), Pt requires multimodal cues throughout session for functional ADL and demonstrates decreased task initiation. She is overall min A for UB ADL with multimodal cues and mod to max A for LB ADL. Pt will require 24 hour supervision for safety and physical assist - due to dementia - would prefer that Pt return to familiar environment but if family and caregivers unable to provide 24 hour will require SNF level care.     Follow Up Recommendations  Home health OT;Supervision/Assistance - 24 hour;SNF(if family unable to provide 24 hour supervision - SNF)    Equipment Recommendations  3 in 1 bedside commode    Recommendations for Other Services       Precautions / Restrictions Precautions Precautions: Fall Restrictions Weight Bearing Restrictions: No      Mobility Bed Mobility Overal bed mobility: Needs Assistance Bed Mobility: Sit to Supine       Sit to supine: Min assist   General bed mobility comments: multimodal cues for sequencing, assist for legs (min ) and Pt reaching out for trunk elevation assist  Transfers Overall transfer level: Needs assistance Equipment used: 1 person hand held  assist Transfers: Sit to/from [employer] Transfers Sit to Stand: Mod assist Stand pivot transfers: Mod assist       General transfer comment: multimodal cues and mod A required, face to face    Balance Overall balance assessment: Needs assistance;History of Falls Sitting-balance support: Feet supported;Single extremity supported Sitting balance-Leahy Scale: Fair     Standing balance support: During functional activity;Bilateral upper extremity supported Standing balance-Leahy Scale: Poor Standing balance comment: requires external support, steady assist to balance for pericare while standing                           ADL either performed or assessed with clinical judgement   ADL Overall ADL's : Needs assistance/impaired Eating/Feeding: Minimal assistance;Sitting   Grooming: Wash/dry hands;Wash/dry face;Minimal assistance;Sitting Grooming Details (indicate cue type and reason): in recliner Upper Body Bathing: Moderate assistance   Lower Body Bathing: Maximal assistance   Upper Body Dressing : Moderate assistance   Lower Body Dressing: Maximal assistance   Toilet Transfer: Moderate assistance;Stand-pivot;BSC Toilet Transfer Details (indicate cue type and reason): face to face Toileting- Clothing Manipulation and Hygiene: Moderate assistance;Sit to/from stand Toileting - Clothing Manipulation Details (indicate cue type and reason): assist to steady and balance; multimodal cues      Functional mobility during ADLs: Moderate assistance General ADL Comments: Pt cooperative, requires initiation and multimodal cues     Vision         Perception     Praxis  Pertinent Vitals/Pain Pain Assessment: Faces Faces Pain Scale: Hurts little more Pain Location: L shoulder Pain Descriptors / Indicators: Discomfort;Grimacing Pain Intervention(s): Monitored during session;Repositioned;Limited activity within patient's tolerance     Hand Dominance      Extremity/Trunk Assessment Upper Extremity Assessment Upper Extremity Assessment: Generalized weakness   Lower Extremity Assessment Lower Extremity Assessment: Defer to PT evaluation   Cervical / Trunk Assessment Cervical / Trunk Assessment: Kyphotic   Communication     Cognition Arousal/Alertness: Awake/alert Behavior During Therapy: WFL for tasks assessed/performed Overall Cognitive Status: No family/caregiver present to determine baseline cognitive functioning Area of Impairment: Orientation;Following commands;Awareness;Problem solving                 Orientation Level: Time;Situation     Following Commands: Follows one step commands with increased time   Awareness: Intellectual Problem Solving: Slow processing;Decreased initiation;Difficulty sequencing General Comments: oriented to self and hospital, easily distracted, multimodal cues needed for sequencing and task attention   General Comments       Exercises     Shoulder Instructions      Home Living Family/patient expects to be discharged to:: Private residence Living Arrangements: Alone(Her son Alicia Harrison lives in an RV in the driveway) Available Help at Discharge: Family;Personal care attendant(unsure of frequency/coverage)                         Home Equipment: Gilford Rile - 2 wheels;Cane - single point   Additional Comments: pt unreliable historian chart indocates has caregivers but not 24/7.      Prior Functioning/Environment Level of Independence: Needs assistance  Gait / Transfers Assistance Needed: uses a cane or RW ADL's / Homemaking Assistance Needed: unsure   Comments: Pt unreliable historian, information gleaned from chart review        OT Problem List: Impaired balance (sitting and/or standing);Decreased safety awareness      OT Treatment/Interventions: Self-care/ADL training;DME and/or AE instruction;Therapeutic activities;Visual/perceptual remediation/compensation;Patient/family  education;Balance training    OT Goals(Current goals can be found in the care plan section) Acute Rehab OT Goals Patient Stated Goal: none stated OT Goal Formulation: Patient unable to participate in goal setting Time For Goal Achievement: 10/18/19 Potential to Achieve Goals: Good  OT Frequency: Min 2X/week   Barriers to D/C:    Pt will require 24/7 supervision       Co-evaluation              AM-PAC OT "6 Clicks" Daily Activity     Outcome Measure Help from another person eating meals?: A Little Help from another person taking care of personal grooming?: A Little Help from another person toileting, which includes using toliet, bedpan, or urinal?: A Lot Help from another person bathing (including washing, rinsing, drying)?: A Lot Help from another person to put on and taking off regular upper body clothing?: A Lot Help from another person to put on and taking off regular lower body clothing?: A Lot 6 Click Score: 14   End of Session Nurse Communication: Mobility status  Activity Tolerance: Patient tolerated treatment well Patient left: in chair;with call bell/phone within reach;with chair alarm set  OT Visit Diagnosis: Unsteadiness on feet (R26.81);Other abnormalities of gait and mobility (R26.89);History of falling (Z91.81);Other symptoms and signs involving cognitive function                Time: [id number] OT Time Calculation (min): 25 min Charges:  OT General Charges $OT Visit: 1 Visit OT Evaluation $OT Eval Moderate  Complexity: 1 Mod OT Treatments $Self Care/Home Management : 8-22 mins  Jesse Sans OTR/L Acute Rehabilitation Services Pager: [phone] Office: [place] 10/04/2019, 1:11 PM

## 2019-10-04 NOTE — TOC Progression Note (Signed)
Transition of Care [facility]) - Progression Note    Patient Details  Name: Alicia Harrison MRN: [id number] Date of Birth: 06/12/1925  Transition of Care [facility]) CM/SW Contact  Deirdra Heumann, Juliann Pulse, RN Phone Number: 10/04/2019, 12:52 PM  Clinical Narrative: Await call back from Son about PT recc HH/24hrs asst/SNF.      Expected Discharge Plan: [place] Barriers to Discharge: Continued Medical Work up  Expected Discharge Plan and Services Expected Discharge Plan: [place]   Discharge Planning Services: CM Consult   Living arrangements for the past 2 months: Single Family Home                                       Social Determinants of Health (SDOH) Interventions    Readmission Risk Interventions No flowsheet data found.

## 2019-10-04 NOTE — TOC Initial Note (Signed)
Transition of Care [facility]) - Initial/Assessment Note    Patient Details  Name: Alicia Harrison MRN: [id number] Date of Birth: 1925/09/11  Transition of Care [facility]) CM/SW Contact:    Dessa Phi, RN Phone Number: 10/04/2019, 12:51 PM  Clinical Narrative:Hx:Dementia. Left vm w/Brian son-f/u on d/c plan. PT cans await recc.                  Expected Discharge Plan: [place] Barriers to Discharge: Continued Medical Work up   Patient Goals and CMS Choice Patient states their goals for this hospitalization and ongoing recovery are:: go home CMS Medicare.gov Compare Post Acute Care list provided to:: Patient Represenative (must comment)(Son) Choice offered to / list presented to : Adult Children  Expected Discharge Plan and Services Expected Discharge Plan: [place]   Discharge Planning Services: CM Consult   Living arrangements for the past 2 months: Single Family Home                                      Prior Living Arrangements/Services Living arrangements for the past 2 months: Single Family Home Lives with:: Adult Children Patient language and need for interpreter reviewed:: Yes Do you feel safe going back to the place where you live?: Yes      Need for Family Participation in Patient Care: No (Comment) Care giver support system in place?: Yes (comment) Current home services: Other (comment)(Custodial level private duty care.) Criminal Activity/Legal Involvement Pertinent to Current Situation/Hospitalization: No - Comment as needed  Activities of Daily Living Home Assistive Devices/Equipment: Cane (specify quad or straight), Walker (specify type), Eyeglasses, Shower chair with back(single point cane, front wheeled walker) ADL Screening (condition at time of admission) Patient's cognitive ability adequate to safely complete daily activities?: No Is the patient deaf or have difficulty hearing?: Yes Does the patient have  difficulty seeing, even when wearing glasses/contacts?: No Does the patient have difficulty concentrating, remembering, or making decisions?: Yes Patient able to express need for assistance with ADLs?: No Does the patient have difficulty dressing or bathing?: Yes Independently performs ADLs?: No Communication: Independent Dressing (OT): Needs assistance Is this a change from baseline?: Pre-admission baseline Grooming: Needs assistance Is this a change from baseline?: Pre-admission baseline Bathing: Needs assistance Is this a change from baseline?: Pre-admission baseline Toileting: Dependent Is this a change from baseline?: Change from baseline, expected to last >3days In/Out Bed: Dependent Is this a change from baseline?: Change from baseline, expected to last >3 days Walks in Home: Dependent Is this a change from baseline?: Change from baseline, expected to last >3 days Does the patient have difficulty walking or climbing stairs?: Yes(secondary to weakness) Weakness of Legs: Both Weakness of Arms/Hands: None  Permission Sought/Granted Permission sought to share information with : Case Manager Permission granted to share information with : Yes, Verbal Permission Granted  Share Information with NAME: Case Manager     Permission granted to share info w Relationship: Son [name] [phone]     Emotional Assessment Appearance:: Appears stated age Attitude/Demeanor/Rapport: Gracious Affect (typically observed): Accepting Orientation: : Oriented to Self Alcohol / Substance Use: Not Applicable Psych Involvement: No (comment)  Admission diagnosis:  Encephalopathy [G93.40] Acute cystitis without hematuria [N30.00] AMS (altered mental status) [R41.82] Patient Active Problem List   Diagnosis Date Noted  . AMS (altered mental status) 10/02/2019  . Alzheimer's dementia without behavioral  disturbance ([place]) 05/03/2017  . Cognitive impairment 11/04/2015  . Chronic back pain 11/04/2015   . CKD (chronic kidney disease) 11/04/2015  . HTN (hypertension) 11/04/2015  . Blood in stool 09/08/2011   PCP:  Tisovec, Fransico Him, MD Pharmacy:   [facility] [address], [place] - [place] AT [place] [address] [place] [address] Phone: [phone] Fax: [phone]  [facility] [place], [place] [address] [place] [address] Phone: [phone] Fax: [phone]     Social Determinants of Health (SDOH) Interventions    Readmission Risk Interventions No flowsheet data found.

## 2019-10-05 MED ORDER — CEFDINIR 300 MG PO CAPS
300.0000 mg | ORAL_CAPSULE | Freq: Two times a day (BID) | ORAL | Status: DC
  Administered 2019-10-05 – 2019-10-09 (×9): 300 mg via ORAL
  Filled 2019-10-05 (×9): qty 1

## 2019-10-05 MED ORDER — MEMANTINE HCL 10 MG PO TABS
10.0000 mg | ORAL_TABLET | Freq: Two times a day (BID) | ORAL | Status: DC
  Administered 2019-10-05 – 2019-10-09 (×9): 10 mg via ORAL
  Filled 2019-10-05 (×9): qty 1

## 2019-10-05 MED ORDER — DONEPEZIL HCL 10 MG PO TABS
10.0000 mg | ORAL_TABLET | Freq: Every day | ORAL | Status: DC
  Administered 2019-10-05 – 2019-10-08 (×4): 10 mg via ORAL
  Filled 2019-10-05 (×4): qty 1

## 2019-10-05 NOTE — Progress Notes (Signed)
Physical Therapy Treatment Patient Details Name: Alicia Harrison MRN: [id number] DOB: 01-17-1926 Today's Date: 10/05/2019    History of Present Illness Alicia Harrison  is a 86 y.o. female, with history of dementia, had unwitnessed fall at home , confused and unable tio converse. In the ED 10/02/19 patient was found to have abnormal UA,  Imaging studies including CT head and CT cervical spine was negative.  Chest x-ray was unremarkable.  Pelvic x-ray was unremarkable. At baseline patient uses walker and a cane.  Home health aides take care of the patient but son also lives on property and helps take care of her.    PT Comments    Patient is making good progress with PT and required less physical assistance today but continues to require 24/7 assistance due to altered mental status.    Follow Up Recommendations  Home health PT;SNF;Supervision/Assistance - 24 hour     Equipment Recommendations       Recommendations for Other Services       Precautions / Restrictions Precautions Precautions: Fall Restrictions Weight Bearing Restrictions: No    Mobility  Bed Mobility Overal bed mobility: Needs Assistance Bed Mobility: Supine to Sit     Supine to sit: Min guard     General bed mobility comments: multimodal cues for sequencing  Transfers Overall transfer level: Needs assistance Equipment used: Rolling walker (2 wheeled) Transfers: Sit to/from [employer] Sit to Stand: Min assist Stand pivot transfers: Min assist       General transfer comment: min assist, cues for sequencing of steps and placement of hands  Ambulation/Gait Ambulation/Gait assistance: Min guard;Min assist Gait Distance (Feet): 50 Feet Assistive device: Rolling walker (2 wheeled) Gait Pattern/deviations: Step-through pattern;Decreased step length - right;Decreased step length - left;Decreased stride length;Trunk flexed;Narrow base of support Gait velocity: decreased   General Gait  Details: slow, labored gait, frequent verbal cues to continue walking/turning and sequencing of steps; limited by fatigue   Stairs             Wheelchair Mobility    Modified Rankin (Stroke Patients Only)       Balance Overall balance assessment: Needs assistance;History of Falls Sitting-balance support: Feet supported;Single extremity supported Sitting balance-Leahy Scale: Fair     Standing balance support: During functional activity;Bilateral upper extremity supported Standing balance-Leahy Scale: Poor Standing balance comment: requires external support                            Cognition Arousal/Alertness: Awake/alert Behavior During Therapy: WFL for tasks assessed/performed Overall Cognitive Status: No family/caregiver present to determine baseline cognitive functioning Area of Impairment: Orientation;Following commands;Awareness;Problem solving                 Orientation Level: Time;Situation     Following Commands: Follows one step commands with increased time   Awareness: Intellectual Problem Solving: Slow processing;Decreased initiation;Difficulty sequencing General Comments: oriented to [place], multomodal cues and reminders to keep moving      Exercises      General Comments        Pertinent Vitals/Pain Pain Assessment: Faces Pain Score: 0-No pain    Home Living Family/patient expects to be discharged to:: Private residence Living Arrangements: Alone(Her son Alicia Harrison lives in an RV in the driveway) Available Help at Discharge: Family;Personal care attendant(unsure of frequency/coverage)         Home Equipment: Gilford Rile - 2 wheels;Cane - single point Additional Comments: pt unreliable historian  chart indocates has caregivers but not 24/7.    Prior Function Level of Independence: Needs assistance  Gait / Transfers Assistance Needed: uses a cane or RW ADL's / Homemaking Assistance Needed: unsure Comments: Pt unreliable  historian, information gleaned from chart review   PT Goals (current goals can now be found in the care plan section) Acute Rehab PT Goals Patient Stated Goal: Go home. PT Goal Formulation: With patient Time For Goal Achievement: 10/19/19 Potential to Achieve Goals: Fair    Frequency           PT Plan      Co-evaluation              AM-PAC PT "6 Clicks" Mobility   Outcome Measure  Help needed turning from your back to your side while in a flat bed without using bedrails?: A Little Help needed moving from lying on your back to sitting on the side of a flat bed without using bedrails?: A Little Help needed moving to and from a bed to a chair (including a wheelchair)?: A Little Help needed standing up from a chair using your arms (e.g., wheelchair or bedside chair)?: A Little Help needed to walk in hospital room?: A Little Help needed climbing 3-5 steps with a railing? : A Lot 6 Click Score: 17    End of Session Equipment Utilized During Treatment: Gait belt Activity Tolerance: Patient tolerated treatment well Patient left: in bed;with call bell/phone within reach;with chair alarm set Nurse Communication: Mobility status PT Visit Diagnosis: Unsteadiness on feet (R26.81);Difficulty in walking, not elsewhere classified (R26.2);History of falling (Z91.81)     Time: [id number] PT Time Calculation (min) (ACUTE ONLY): 28 min  Charges:  $Gait Training: 8-22 mins $Therapeutic Activity: 8-22 mins                     Floria Raveling. Hartnett-Rands, MS, PT Per [place] [id number] 10/05/2019, 5:18 PM

## 2019-10-05 NOTE — TOC Progression Note (Signed)
Transition of Care [facility]) - Progression Note    Patient Details  Name: Alicia Harrison MRN: [id number] Date of Birth: 1926/02/19  [facility]) CM/SW Contact  Mahabir, Juliann Pulse, RN Phone Number: 10/05/2019, 12:10 PM  Clinical Narrative: Patient w/dementia-spoke to [place] in rm w/his brother Patrick [place] on face [phone](please contact [place] for any updates for the next 2 days) about d/c plans-agrees to SNF-faxed fl2;pursuing pasrr;await bed offers, choice,then get auth.    Expected Discharge Plan: [place] Barriers to Discharge: Continued Medical Work up  Expected Discharge Plan and Services Expected Discharge Plan: [place]   Discharge Planning Services: CM Consult   Living arrangements for the past 2 months: Single Family Home                                       Social Determinants of Health (SDOH) Interventions    Readmission Risk Interventions No flowsheet data found.

## 2019-10-05 NOTE — H&P (Signed)
PROGRESS NOTE    Alicia Harrison  [id number]  DOB: [birth year]  PCP: Haywood Pao, MD Admit date:10/02/2019 86 y.o. female, with history of dementia who uses walker and a cane at baseline, brought in by son after an unwitnessed fall / found on the floor with AMS.  Usually she is able to carry out a conversation but has not been able to talk.EMS was called and patient brought to the ED for further evaluation.Home health aides take care of the patient but son also lives on property and helps take care of her ED Course: Afebrile, BP elevated at systolic [id number] to [id number], diastolic in [id number], WBC [id number], renal function okay, \ found to have abnormal UA, started on IV ceftriaxone.  Imaging studies including CT head and CT cervical spine was negative.  Chest x-ray was unremarkable.  Pelvic x-ray was unremarkable. Hospital course: Patient admitted to [facility] for UTI/metabolic encephalopathy with empiric IV abx. S/P1 lit IVF    Subjective:Appears more alert and communicative today. In bed and son at bedside.   Objective: Vitals:   10/04/19 1223 10/04/19 1355 10/04/19 2011 10/05/19 0537  BP: (!) 147/81 (!) 142/70 (!) 169/78 (!) 172/83  Pulse: 85 94 80 80  Resp:  16 18 20   Temp:  97.8 F (36.6 C) 98.3 F (36.8 C) 98 F (36.7 C)  TempSrc:  Oral Oral Oral  SpO2:  96% 97% 97%  Weight:      Height:        Intake/Output Summary (Last 24 hours) at 10/05/2019 1239 Last data filed at 10/05/2019 0200 Gross per 24 hour  Intake 2127.17 ml  Output --  Net 2127.17 ml   Filed Weights   10/03/19 0451  Weight: 61.7 kg    Physical Examination:  General exam: Appears calm and comfortable  Respiratory system: Clear to auscultation. Respiratory effort normal. Cardiovascular system: S1 & S2 heard, RRR. No JVD, murmurs, rubs, gallops or clicks. No pedal edema. Gastrointestinal system: Abdomen is nondistended, soft and nontender. Normal bowel sounds heard. Central nervous system: Alert and oriented. No  new focal neurological deficits. Extremities: No contractures, edema or joint deformities.  Skin: No rashes, lesions or ulcers Psychiatry: Judgement and insight appear impaired. Mood & affect appropriate.   Data Reviewed: I have personally reviewed following labs and imaging studies  CBC: Recent Labs  Lab 10/02/19 1228 10/03/19 0436  WBC 11.0* 8.8  NEUTROABS 9.6*  --   HGB 13.0 15.9*  HCT 39.5 47.7*  MCV 93.8 92.1  PLT 243 [id number]   Basic Metabolic Panel: Recent Labs  Lab 10/02/19 1228 10/03/19 0436  NA 139 138  K 4.4 3.8  CL 103 106  CO2 26 23  GLUCOSE 129* 124*  BUN 26* 17  CREATININE 1.18* 1.00  CALCIUM 9.3 8.6*   GFR: Estimated Creatinine Clearance: 35.6 mL/min (by C-G formula based on SCr of 1 mg/dL). Liver Function Tests: Recent Labs  Lab 10/03/19 0436  AST 25  ALT 16  ALKPHOS 71  BILITOT 0.9  PROT 6.3*  ALBUMIN 3.5   No results for input(s): LIPASE, AMYLASE in the last 168 hours. No results for input(s): AMMONIA in the last 168 hours. Coagulation Profile: No results for input(s): INR, PROTIME in the last 168 hours. Cardiac Enzymes: No results for input(s): CKTOTAL, CKMB, CKMBINDEX, TROPONINI in the last 168 hours. BNP (last 3 results) No results for input(s): PROBNP in the last 8760 hours. HbA1C: No results for input(s): HGBA1C in the last 72 hours. CBG: No  results for input(s): GLUCAP in the last 168 hours. Lipid Profile: No results for input(s): CHOL, HDL, LDLCALC, TRIG, CHOLHDL, LDLDIRECT in the last 72 hours. Thyroid Function Tests: No results for input(s): TSH, T4TOTAL, FREET4, T3FREE, THYROIDAB in the last 72 hours. Anemia Panel: No results for input(s): VITAMINB12, FOLATE, FERRITIN, TIBC, IRON, RETICCTPCT in the last 72 hours. Sepsis Labs: No results for input(s): PROCALCITON, LATICACIDVEN in the last 168 hours.  Recent Results (from the past 240 hour(s))  Urine culture     Status: Abnormal   Collection Time: 10/02/19 11:49 AM    Specimen: Urine, Clean Catch  Result Value Ref Range Status   Specimen Description   Final    URINE, CLEAN CATCH Performed at [facility], [place] [address], [place], [place] [address]    Special Requests   Final    NONE Performed at [facility], [place] [address] [place], [place] [address]    Culture >=100,000 COLONIES/mL ESCHERICHIA COLI (A)  Final   Report Status 10/04/2019 FINAL  Final   Organism ID, Bacteria ESCHERICHIA COLI (A)  Final      Susceptibility   Escherichia coli - MIC*    AMPICILLIN 4 SENSITIVE Sensitive     CEFAZOLIN <=4 SENSITIVE Sensitive     CEFTRIAXONE <=1 SENSITIVE Sensitive     CIPROFLOXACIN <=0.25 SENSITIVE Sensitive     GENTAMICIN <=1 SENSITIVE Sensitive     IMIPENEM <=0.25 SENSITIVE Sensitive     NITROFURANTOIN <=16 SENSITIVE Sensitive     TRIMETH/SULFA <=20 SENSITIVE Sensitive     AMPICILLIN/SULBACTAM <=2 SENSITIVE Sensitive     PIP/TAZO <=4 SENSITIVE Sensitive     * >=100,000 COLONIES/mL ESCHERICHIA COLI  SARS Coronavirus 2 by RT PCR (hospital order, performed in [place] hospital lab) Nasopharyngeal Nasopharyngeal Swab     Status: None   Collection Time: 10/02/19  1:09 PM   Specimen: Nasopharyngeal Swab  Result Value Ref Range Status   SARS Coronavirus 2 NEGATIVE NEGATIVE Final    Comment: (NOTE) SARS-CoV-2 target nucleic acids are NOT DETECTED. The SARS-CoV-2 RNA is generally detectable in upper and lower respiratory specimens during the acute phase of infection. The lowest concentration of SARS-CoV-2 viral copies this assay can detect is 250 copies / mL. A negative result does not preclude SARS-CoV-2 infection and should not be used as the sole basis for treatment or other patient management decisions.  A negative result may occur with improper specimen collection / handling, submission of specimen other than nasopharyngeal swab, presence of viral mutation(s) within the areas targeted by this assay, and  inadequate number of viral copies (<250 copies / mL). A negative result must be combined with clinical observations, patient history, and epidemiological information. Fact Sheet for Patients:   StrictlyIdeas.no Fact Sheet for Healthcare Providers: BankingDealers.co.za This test is not yet approved or cleared  by the [place] FDA and has been authorized for detection and/or diagnosis of SARS-CoV-2 by FDA under an Emergency Use Authorization (EUA).  This EUA will remain in effect (meaning this test can be used) for the duration of the COVID-19 declaration under Section 564(b)(1) of the Act, 21 U.S.C. section 360bbb-3(b)(1), unless the authorization is terminated or revoked sooner. Performed at [facility], [place] [address] [place], [place] [address]       Radiology Studies: No results found.      Scheduled Meds: . acetaminophen  500 mg Oral BID  . amLODipine  10 mg Oral Daily  . donepezil  10  mg Oral QHS  . enoxaparin (LOVENOX) injection  40 mg Subcutaneous Q24H  . memantine  10 mg Oral BID   Continuous Infusions: . cefTRIAXone (ROCEPHIN)  IV 1 g (10/04/19 1419)  . dextrose 5 % and 0.9% NaCl 100 mL/hr at 10/05/19 [id number]     Assessment/Plan:  E. coli UTI with acute metabolic encephalopathy: on IV abx-day 4/5-will transition to oral abx.  Urine culture growing pansensitive E. coli.  Leucocytosis resolved.  Plan to discharge today but son unable to extend caregiver services at home, wants SNF.  Mental status now appears close to baseline.  HTN : ?new diagnosis. Not on any home meds. Started on Norvasc 5 mg daily -increased to 10 mg as SBP still elevated in systolic 123456., hydralazine prn. Address pain although looks comfortable..   Fall: mechanical vs UTI related. Trauma w/u and stroke w/u in ED unremarkable. Per son has been falling almost every week for last few months.  Dementia: not been taking home meds  (aricept/namenda), resumed  DVT prophylaxis: lovenox Code Status: DNR Family / Patient Communication: d/w son at bedside Disposition Plan:   Status is: Inpatient    Dispo: The patient is from: Home              Anticipated d/c is to: SNF based on PT eval/family discussion              Anticipated d/c date is: 2 days              Patient currently is medically stable to d/c.      LOS: 3 days    Time spent: 25 minutes    Guilford Shi, MD [facility] Pager in [place]  If 7PM-7AM, please contact night-coverage [email or web address] 10/05/2019, 12:39 PM

## 2019-10-05 NOTE — NC FL2 (Addendum)
[place] LEVEL OF CARE SCREENING TOOL     IDENTIFICATION  Patient Name: Alicia Harrison Birthdate: 1926-03-08 Sex: female Admission Date (Current Location): 10/02/2019  [facility] and [place] Number:  Herbalist and Address:  [facility]  [place] [address] [place]      Provider Number: [id number]  Attending Physician Name and Address:  Guilford Shi, MD  Relative Name and Phone Number:  Eriana Bob [phone]    Current Level of Care: Hospital Recommended Level of Care: [place] Prior Approval Number:    Date Approved/Denied:   PASRR Number:  [id number]  Discharge Plan: SNF    Current Diagnoses: Patient Active Problem List   Diagnosis Date Noted  . AMS (altered mental status) 10/02/2019  . Alzheimer's dementia without behavioral disturbance ([place]) 05/03/2017  . Cognitive impairment 11/04/2015  . Chronic back pain 11/04/2015  . CKD (chronic kidney disease) 11/04/2015  . HTN (hypertension) 11/04/2015  . Blood in stool 09/08/2011    Orientation RESPIRATION BLADDER Height & Weight     Self  Normal Continent Weight: 61.7 kg Height:  5\' 7"  (170.2 cm)  BEHAVIORAL SYMPTOMS/MOOD NEUROLOGICAL BOWEL NUTRITION STATUS      Continent Diet(Soft)  AMBULATORY STATUS COMMUNICATION OF NEEDS Skin   Limited Assist Verbally Normal                       Personal Care Assistance Level of Assistance  Bathing, Feeding, Dressing Bathing Assistance: Limited assistance Feeding assistance: Limited assistance Dressing Assistance: Limited assistance     Functional Limitations Info  Sight, Hearing, Speech Sight Info: Impaired(eyeglasses) Hearing Info: Adequate Speech Info: Adequate    SPECIAL CARE FACTORS FREQUENCY                       Contractures Contractures Info: Not present    Additional Factors Info  Code Status, Allergies Code Status Info: DNR Allergies Info: NKA           Current  Medications (10/05/2019):  This is the current hospital active medication list Current Facility-Administered Medications  Medication Dose Route Frequency Provider Last Rate Last Admin  . acetaminophen (TYLENOL) tablet 650 mg  650 mg Oral Q6H PRN Oswald Hillock, MD       Or  . acetaminophen (TYLENOL) suppository 650 mg  650 mg Rectal Q6H PRN Oswald Hillock, MD      . acetaminophen (TYLENOL) tablet 500 mg  500 mg Oral BID Guilford Shi, MD   500 mg at 10/04/19 2112  . amLODipine (NORVASC) tablet 10 mg  10 mg Oral Daily Guilford Shi, MD   10 mg at 10/04/19 1224  . cefTRIAXone (ROCEPHIN) 1 g in sodium chloride 0.9 % 100 mL IVPB  1 g Intravenous Q24H Oswald Hillock, MD 200 mL/hr at 10/04/19 1419 1 g at 10/04/19 1419  . dextrose 5 %-0.9 % sodium chloride infusion   Intravenous Continuous Oswald Hillock, MD 100 mL/hr at 10/05/19 0714 New Bag at 10/05/19 0714  . enoxaparin (LOVENOX) injection 40 mg  40 mg Subcutaneous Q24H Oswald Hillock, MD   40 mg at 10/04/19 2113  . hydrALAZINE (APRESOLINE) injection 10 mg  10 mg Intravenous Q6H PRN Oswald Hillock, MD   10 mg at 10/05/19 0604  . hydrALAZINE (APRESOLINE) tablet 25 mg  25 mg Oral Q6H PRN Oswald Hillock, MD   25 mg at 10/02/19 1447  .  ondansetron (ZOFRAN) tablet 4 mg  4 mg Oral Q6H PRN Oswald Hillock, MD       Or  . ondansetron (ZOFRAN) injection 4 mg  4 mg Intravenous Q6H PRN Oswald Hillock, MD         Discharge Medications: Please see discharge summary for a list of discharge medications.  Relevant Imaging Results:  Relevant Lab Results:   Additional Information [id number] [place], [place]

## 2019-10-05 NOTE — Plan of Care (Signed)
  Problem: Clinical Measurements: Goal: Diagnostic test results will improve Outcome: Progressing   Problem: Nutrition: Goal: Adequate nutrition will be maintained Outcome: Progressing   Problem: Elimination: Goal: Will not experience complications related to urinary retention Outcome: Progressing   Problem: Safety: Goal: Ability to remain free from injury will improve Outcome: Progressing   Problem: Skin Integrity: Goal: Risk for impaired skin integrity will decrease Outcome: Progressing

## 2019-10-05 NOTE — Care Management Note (Signed)
Case Management Note  Patient Details  Name: Alicia Harrison MRN: [id number] Date of Birth: 1925/07/01  Subjective/Objective:        Transition of Care (TOC) -30 day Note       Patient Details  Name: Alicia Harrison, Alicia Harrison [id number] Date of Birth:06/21/1925   Transition of Care [facility]) CM/SW Contact  Name:Symia Herdt [place] Phone Number:[phone] Date:10/05/19 Time:11:55P   MUST ID: [id number]   To Whom it May Concern:   Please be advised that the above patient will require a short-term nursing home stay, anticipated 30 days or less rehabilitation and strengthening. The plan is for return home.      _______________________________________     MD                                                   Date   Action/Plan:   Expected Discharge Date:                  Expected Discharge Plan:  SNF In-House Referral:     Discharge planning Services  CM Consult  Post Acute Care Choice:    Choice offered to:  Adult Children  DME Arranged:    DME Agency:     HH Arranged:    HH Agency:     Status of Service:     If discussed at [place] of Stay Meetings, dates discussed:    Additional Comments:  Dessa Phi, RN 10/05/2019, 11:52 AM

## 2019-10-06 DIAGNOSIS — R400 Somnolence: Secondary | ICD-10-CM

## 2019-10-06 NOTE — Progress Notes (Signed)
OT Cancellation Note  Patient Details Name: Alicia Harrison MRN: [id number] DOB: 18-Aug-1925   Cancelled Treatment:    Reason Eval/Treat Not Completed: Patient declined, no reason specified(Attempted multiple ways to get patient to participate in therapy. Patient pleasant but adamant about not getting out of bed or performing functional task.)  [name] L [name] 10/06/2019, 4:05 PM

## 2019-10-06 NOTE — TOC Progression Note (Signed)
Transition of Care [facility]) - Progression Note    Patient Details  Name: Alicia Harrison MRN: [id number] Date of Birth: 05-Oct-1925  Transition of Care [facility]) CM/SW Contact  Lennart Pall, LCSW Phone Number: 10/06/2019, 1:44 PM  Clinical Narrative:   Contacted pt's son, Patrick [place], to review SNF bed offers received. Emailed list of the facilities for his review and alerted that, if pt medically cleared for d/c over weekend then [facility] staff would contact to get his choices.  Per MD note, not medically ready today.    Expected Discharge Plan: [place] Barriers to Discharge: Continued Medical Work up  Expected Discharge Plan and Services Expected Discharge Plan: [place]   Discharge Planning Services: CM Consult   Living arrangements for the past 2 months: Single Family Home                                       Social Determinants of Health (SDOH) Interventions    Readmission Risk Interventions No flowsheet data found.

## 2019-10-06 NOTE — Progress Notes (Signed)
[facility]                                                                              Patient Demographics  Alicia Harrison, is a 86 y.o. female, DOB - [birth year], [id number]  Admit date - 10/02/2019   Admitting Physician Oswald Hillock, MD  Outpatient Primary MD for the patient is Tisovec, Fransico Him, MD  Outpatient specialists:   LOS - 4  days   Medical records reviewed and are as summarized below:    Chief Complaint  Patient presents with  . Fall       Brief summary   Admit date:10/02/2019 86 y.o.female,with history of dementia who uses walker and a cane at baseline, brought in by son after an unwitnessed fall / found on the floor with AMS. Usually she is able to carry out a conversation but has not been able to talk.EMS was called and patient brought to the ED for further evaluation.Home health aides take care of the patient but son also lives on property and helps take care of her ED Course: Afebrile, BP elevated at systolic [id number] to [id number], diastolic in [id number], WBC [id number], renal function okay, found to have abnormal UA, started on IV ceftriaxone. Imaging studies including CT head and CT cervical spine was negative. CXR, pelvic x-ray was unremarkable.  Hospital course: Patient admitted to [facility] for UTI/metabolic encephalopathy with empiric IV abx. S/P1 lit IVF    Assessment & Plan    Active Problems: Acute metabolic encephalopathy, superimposed on dementia -Likely worsened due to E. coli UTI -Today appears to be sleepy and lethargic, urine culture had shown E. coli, pansensitive, patient was placed on IV antibiotics, transitioned to oral cefdinir on 5/21  Hypertension -New diagnosis, not on any home meds -Continue Norvasc 10 mg daily, hydralazine as needed  Generalized debility, fall -Likely mechanical versus UTI related, work-up in ED negative -Per patient's son has been falling almost every week for last few months -PT recommended home health  versus SNF, supervision 24 hours -Per son, requested SNF, social work consulted  Dementia Has not been taking home meds Aricept, Namenda, resume  FEN I's and O's with 7.4 L positive, will DC IV fluids   Code Status: DNR DVT Prophylaxis:  Lovenox  Family Communication: Family not at bedside  Disposition Plan:     Status is: Inpatient  Remains inpatient appropriate because:Inpatient level of care appropriate due to severity of illness   Dispo: The patient is from: Home              Anticipated d/c is to: SNF              Anticipated d/c date is: 2 days              Patient currently is not medically stable to d/c.       Time Spent in minutes   35 minutes  Procedures:  None  Consultants:   None  Antimicrobials:   Anti-infectives (From admission, onward)   Start     Dose/Rate Route Frequency Ordered Stop   10/05/19 1400  cefdinir (OMNICEF) capsule 300 mg  300 mg Oral Every 12 hours 10/05/19 1239     10/03/19 1400  cefTRIAXone (ROCEPHIN) 1 g in sodium chloride 0.9 % 100 mL IVPB  Status:  Discontinued     1 g 200 mL/hr over 30 Minutes Intravenous Every 24 hours 10/02/19 1901 10/05/19 1239   10/02/19 1315  cefTRIAXone (ROCEPHIN) 1 g in sodium chloride 0.9 % 100 mL IVPB     1 g 200 mL/hr over 30 Minutes Intravenous  Once 10/02/19 1309 10/02/19 1418          Medications  Scheduled Meds: . acetaminophen  500 mg Oral BID  . amLODipine  10 mg Oral Daily  . cefdinir  300 mg Oral Q12H  . donepezil  10 mg Oral QHS  . enoxaparin (LOVENOX) injection  40 mg Subcutaneous Q24H  . memantine  10 mg Oral BID   Continuous Infusions: . dextrose 5 % and 0.9% NaCl 100 mL/hr at 10/05/19 1830   PRN Meds:.acetaminophen **OR** acetaminophen, hydrALAZINE, hydrALAZINE, ondansetron **OR** ondansetron (ZOFRAN) IV      Subjective:   Alicia Harrison was seen and examined today.  Somewhat lethargic, unable to obtain review of system from the patient due to dementia, mental  status.  Overnight no acute issues, no fevers or chills, abdominal pain  Objective:   Vitals:   10/05/19 0537 10/05/19 1302 10/05/19 2028 10/06/19 1225  BP: (!) 172/83 (!) 159/83 (!) 141/85 (!) 146/78  Pulse: 80 77 82 80  Resp: 20 17 20 20   Temp: 98 F (36.7 C) 98.1 F (36.7 C) 98 F (36.7 C) 98.4 F (36.9 C)  TempSrc: Oral Oral Oral Oral  SpO2: 97% 97% 99% 100%  Weight:      Height:        Intake/Output Summary (Last 24 hours) at 10/06/2019 1228 Last data filed at 10/06/2019 1026 Gross per 24 hour  Intake 1335.6 ml  Output 751 ml  Net 584.6 ml     Wt Readings from Last 3 Encounters:  10/03/19 61.7 kg  08/18/19 48.7 kg  05/03/17 48.7 kg     Exam  General: Somnolent  Cardiovascular: S1 S2 auscultated, no murmurs, RRR  Respiratory: Clear to auscultation bilaterally, anteriorly  Gastrointestinal: Soft, nontender, nondistended, + bowel sounds  Ext: no pedal edema bilaterally  Neuro: Unable to assess with dementia and somnolent  Musculoskeletal: No digital cyanosis, clubbing  Skin: No rashes  Psych: somnolent, not following commands   Data Reviewed:  I have personally reviewed following labs and imaging studies  Micro Results Recent Results (from the past 240 hour(s))  Urine culture     Status: Abnormal   Collection Time: 10/02/19 11:49 AM   Specimen: Urine, Clean Catch  Result Value Ref Range Status   Specimen Description   Final    URINE, CLEAN CATCH Performed at [facility], [place] [address], [place], [place] [address]    Special Requests   Final    NONE Performed at [facility], [place] [address] [place], [place] [address]    Culture >=100,000 COLONIES/mL ESCHERICHIA COLI (A)  Final   Report Status 10/04/2019 FINAL  Final   Organism ID, Bacteria ESCHERICHIA COLI (A)  Final      Susceptibility   Escherichia coli - MIC*    AMPICILLIN 4 SENSITIVE Sensitive     CEFAZOLIN <=4 SENSITIVE Sensitive      CEFTRIAXONE <=1 SENSITIVE Sensitive     CIPROFLOXACIN <=0.25 SENSITIVE Sensitive     GENTAMICIN <=1 SENSITIVE Sensitive  IMIPENEM <=0.25 SENSITIVE Sensitive     NITROFURANTOIN <=16 SENSITIVE Sensitive     TRIMETH/SULFA <=20 SENSITIVE Sensitive     AMPICILLIN/SULBACTAM <=2 SENSITIVE Sensitive     PIP/TAZO <=4 SENSITIVE Sensitive     * >=100,000 COLONIES/mL ESCHERICHIA COLI  SARS Coronavirus 2 by RT PCR (hospital order, performed in [facility] hospital lab) Nasopharyngeal Nasopharyngeal Swab     Status: None   Collection Time: 10/02/19  1:09 PM   Specimen: Nasopharyngeal Swab  Result Value Ref Range Status   SARS Coronavirus 2 NEGATIVE NEGATIVE Final    Comment: (NOTE) SARS-CoV-2 target nucleic acids are NOT DETECTED. The SARS-CoV-2 RNA is generally detectable in upper and lower respiratory specimens during the acute phase of infection. The lowest concentration of SARS-CoV-2 viral copies this assay can detect is 250 copies / mL. A negative result does not preclude SARS-CoV-2 infection and should not be used as the sole basis for treatment or other patient management decisions.  A negative result may occur with improper specimen collection / handling, submission of specimen other than nasopharyngeal swab, presence of viral mutation(s) within the areas targeted by this assay, and inadequate number of viral copies (<250 copies / mL). A negative result must be combined with clinical observations, patient history, and epidemiological information. Fact Sheet for Patients:   StrictlyIdeas.no Fact Sheet for Healthcare Providers: BankingDealers.co.za This test is not yet approved or cleared  by the [place] FDA and has been authorized for detection and/or diagnosis of SARS-CoV-2 by FDA under an Emergency Use Authorization (EUA).  This EUA will remain in effect (meaning this test can be used) for the duration of the COVID-19 declaration  under Section 564(b)(1) of the Act, 21 U.S.C. section 360bbb-3(b)(1), unless the authorization is terminated or revoked sooner. Performed at [facility], [place] [address], [place], [place] [address]     Radiology Reports CT Head Wo Contrast  Result Date: 10/02/2019 CLINICAL DATA:  Recent fall EXAM: CT HEAD WITHOUT CONTRAST CT CERVICAL SPINE WITHOUT CONTRAST TECHNIQUE: Multidetector CT imaging of the head and cervical spine was performed following the standard protocol without intravenous contrast. Multiplanar CT image reconstructions of the cervical spine were also generated. COMPARISON:  08/18/2019 FINDINGS: CT HEAD FINDINGS Brain: Mild atrophic changes and chronic white matter ischemic changes are noted. No findings to suggest acute hemorrhage, acute infarction or space-occupying mass lesion are seen. Vascular: No hyperdense vessel or unexpected calcification. Skull: Normal. Negative for fracture or focal lesion. Sinuses/Orbits: No acute finding. Other: None. CT CERVICAL SPINE FINDINGS Alignment: Mild anterolisthesis of C4 on C5 of a degenerative nature is noted. Anterolisthesis of C7 on T1 is noted also of a degenerative nature. Skull base and vertebrae: 7 cervical segments are well visualized. Vertebral body height is well maintained. The odontoid is within normal limits. Disc space narrowing is seen from C5-C7. Mild associated osteophytic changes are noted. Multilevel facet hypertrophic changes are seen. No acute fracture is noted. Soft tissues and spinal canal: Surrounding soft tissue structures appear within normal limits. Upper chest: Visualized lung apices are unremarkable. Other: None IMPRESSION: CT of the head: Chronic atrophic and ischemic changes without acute abnormality. CT of the cervical spine: Multilevel degenerative change with mild anterolisthesis. No acute abnormality noted. Electronically Signed   By: Inez Catalina M.D.   On: 10/02/2019 12:08   CT Cervical Spine Wo  Contrast  Result Date: 10/02/2019 CLINICAL DATA:  Recent fall EXAM: CT HEAD WITHOUT CONTRAST CT CERVICAL SPINE WITHOUT CONTRAST TECHNIQUE: Multidetector CT imaging  of the head and cervical spine was performed following the standard protocol without intravenous contrast. Multiplanar CT image reconstructions of the cervical spine were also generated. COMPARISON:  08/18/2019 FINDINGS: CT HEAD FINDINGS Brain: Mild atrophic changes and chronic white matter ischemic changes are noted. No findings to suggest acute hemorrhage, acute infarction or space-occupying mass lesion are seen. Vascular: No hyperdense vessel or unexpected calcification. Skull: Normal. Negative for fracture or focal lesion. Sinuses/Orbits: No acute finding. Other: None. CT CERVICAL SPINE FINDINGS Alignment: Mild anterolisthesis of C4 on C5 of a degenerative nature is noted. Anterolisthesis of C7 on T1 is noted also of a degenerative nature. Skull base and vertebrae: 7 cervical segments are well visualized. Vertebral body height is well maintained. The odontoid is within normal limits. Disc space narrowing is seen from C5-C7. Mild associated osteophytic changes are noted. Multilevel facet hypertrophic changes are seen. No acute fracture is noted. Soft tissues and spinal canal: Surrounding soft tissue structures appear within normal limits. Upper chest: Visualized lung apices are unremarkable. Other: None IMPRESSION: CT of the head: Chronic atrophic and ischemic changes without acute abnormality. CT of the cervical spine: Multilevel degenerative change with mild anterolisthesis. No acute abnormality noted. Electronically Signed   By: Inez Catalina M.D.   On: 10/02/2019 12:08   DG Pelvis Portable  Result Date: 10/02/2019 CLINICAL DATA:  Recent fall with hip pain, initial encounter EXAM: PORTABLE PELVIS 1 VIEWS COMPARISON:  None. FINDINGS: Pelvic ring is intact. No acute fracture is noted. No soft tissue abnormality is seen. IMPRESSION: No acute  abnormality noted. Electronically Signed   By: Inez Catalina M.D.   On: 10/02/2019 11:47   DG Chest Portable 1 View  Result Date: 10/02/2019 CLINICAL DATA:  Fall EXAM: PORTABLE CHEST 1 VIEW COMPARISON:  08/18/2019 FINDINGS: The heart size and mediastinal contours are within normal limits. Both lungs are clear. No pleural effusion or pneumothorax. The visualized skeletal structures are grossly intact. Advanced degenerative changes at the left glenohumeral joint. IMPRESSION: No acute process in the chest. Electronically Signed   By: Macy Mis M.D.   On: 10/02/2019 11:46    Lab Data:  CBC: Recent Labs  Lab 10/02/19 1228 10/03/19 0436  WBC 11.0* 8.8  NEUTROABS 9.6*  --   HGB 13.0 15.9*  HCT 39.5 47.7*  MCV 93.8 92.1  PLT 243 [id number]   Basic Metabolic Panel: Recent Labs  Lab 10/02/19 1228 10/03/19 0436  NA 139 138  K 4.4 3.8  CL 103 106  CO2 26 23  GLUCOSE 129* 124*  BUN 26* 17  CREATININE 1.18* 1.00  CALCIUM 9.3 8.6*   GFR: Estimated Creatinine Clearance: 35.6 mL/min (by C-G formula based on SCr of 1 mg/dL). Liver Function Tests: Recent Labs  Lab 10/03/19 0436  AST 25  ALT 16  ALKPHOS 71  BILITOT 0.9  PROT 6.3*  ALBUMIN 3.5   No results for input(s): LIPASE, AMYLASE in the last 168 hours. No results for input(s): AMMONIA in the last 168 hours. Coagulation Profile: No results for input(s): INR, PROTIME in the last 168 hours. Cardiac Enzymes: No results for input(s): CKTOTAL, CKMB, CKMBINDEX, TROPONINI in the last 168 hours. BNP (last 3 results) No results for input(s): PROBNP in the last 8760 hours. HbA1C: No results for input(s): HGBA1C in the last 72 hours. CBG: No results for input(s): GLUCAP in the last 168 hours. Lipid Profile: No results for input(s): CHOL, HDL, LDLCALC, TRIG, CHOLHDL, LDLDIRECT in the last 72 hours. Thyroid Function Tests: No results  for input(s): TSH, T4TOTAL, FREET4, T3FREE, THYROIDAB in the last 72 hours. Anemia Panel: No results  for input(s): VITAMINB12, FOLATE, FERRITIN, TIBC, IRON, RETICCTPCT in the last 72 hours. Urine analysis:    Component Value Date/Time   COLORURINE YELLOW 10/02/2019 1149   APPEARANCEUR HAZY (A) 10/02/2019 1149   LABSPEC 1.016 10/02/2019 1149   PHURINE 7.0 10/02/2019 1149   GLUCOSEU NEGATIVE 10/02/2019 1149   HGBUR MODERATE (A) 10/02/2019 1149   BILIRUBINUR NEGATIVE 10/02/2019 1149   KETONESUR 5 (A) 10/02/2019 1149   PROTEINUR 100 (A) 10/02/2019 1149   NITRITE POSITIVE (A) 10/02/2019 1149   LEUKOCYTESUR NEGATIVE 10/02/2019 1149     Audelia Knape M.D. [facility] 10/06/2019, 12:28 PM   Call night coverage person covering after 7pm

## 2019-10-06 NOTE — Plan of Care (Signed)

## 2019-10-07 LAB — BASIC METABOLIC PANEL
Anion gap: 7 (ref 5–15)
BUN: 14 mg/dL (ref 8–23)
CO2: 25 mmol/L (ref 22–32)
Calcium: 8.8 mg/dL — ABNORMAL LOW (ref 8.9–10.3)
Chloride: 109 mmol/L (ref 98–111)
Creatinine, Ser: 1.14 mg/dL — ABNORMAL HIGH (ref 0.44–1.00)
GFR calc Af Amer: 49 mL/min — ABNORMAL LOW (ref >60)
GFR calc non Af Amer: 42 mL/min — ABNORMAL LOW (ref >60)
Glucose, Bld: 92 mg/dL (ref 70–99)
Potassium: 3.5 mmol/L (ref 3.5–5.1)
Sodium: 141 mmol/L (ref 135–145)

## 2019-10-07 LAB — CBC
HCT: 37.5 % (ref 36.0–46.0)
Hemoglobin: 12.3 g/dL (ref 12.0–15.0)
MCH: 30.5 pg (ref 26.0–34.0)
MCHC: 32.8 g/dL (ref 30.0–36.0)
MCV: 93.1 fL (ref 80.0–100.0)
Platelets: 260 10*3/uL (ref 150–400)
RBC: 4.03 MIL/uL (ref 3.87–5.11)
RDW: 13.6 % (ref 11.5–15.5)
WBC: 8.2 10*3/uL (ref 4.0–10.5)
nRBC: 0 % (ref 0.0–0.2)

## 2019-10-07 LAB — SARS CORONAVIRUS 2 (TAT 6-24 HRS): SARS Coronavirus 2: NEGATIVE

## 2019-10-07 NOTE — TOC Progression Note (Addendum)
Transition of Care [facility]) - Progression Note    Patient Details  Name: Alicia Harrison MRN: [id number] Date of Birth: 1925-06-23  Transition of Care [facility]) CM/SW [place], [place] Phone Number: 10/07/2019, 1:04 PM  Clinical Narrative:   LCSW collaborated with attending physician and was informed that son has chosen [place] [facility] for SNF placement. LCSW completed call to [facility] and spoke with Kirstin. She reports that they only have COVID positive beds available today but will have room tomorrow. COVID test ordered by attending today. LCSW completed call to patient's son but was unable to reach him. LCSW left a HIPPA compliant voice message for son with the following update and information. Kirstin ask that SW contact admissions director Irine Seal tomorrow to coordinate SNF placement. Authorization # is [id number].   Expected Discharge Plan: [place] Barriers to Discharge: Continued Medical Work up  Expected Discharge Plan and Services Expected Discharge Plan: [place]   Discharge Planning Services: CM Consult   Living arrangements for the past 2 months: Single Family Home                  Readmission Risk Interventions No flowsheet data found.

## 2019-10-07 NOTE — Progress Notes (Signed)
[facility]                                                                              Patient Demographics  Alicia Harrison, is a 86 y.o. female, DOB - 11-21-1925, [id number]  Admit date - 10/02/2019   Admitting Physician Oswald Hillock, MD  Outpatient Primary MD for the patient is Tisovec, Fransico Him, MD  Outpatient specialists:   LOS - 5  days   Medical records reviewed and are as summarized below:    Chief Complaint  Patient presents with  . Fall       Brief summary   Admit date:10/02/2019 86 y.o.female,with history of dementia who uses walker and a cane at baseline, brought in by son after an unwitnessed fall / found on the floor with AMS. Usually she is able to carry out a conversation but has not been able to talk.EMS was called and patient brought to the ED for further evaluation.Home health aides take care of the patient but son also lives on property and helps take care of her ED Course: Afebrile, BP elevated at systolic [id number] to [id number], diastolic in [id number], WBC [id number], renal function okay, found to have abnormal UA, started on IV ceftriaxone. Imaging studies including CT head and CT cervical spine was negative. CXR, pelvic x-ray was unremarkable.  Hospital course: Patient admitted to [facility] for UTI/metabolic encephalopathy with empiric IV abx. S/P1 lit IVF    Assessment & Plan    Active Problems: Acute metabolic encephalopathy, superimposed on dementia -Likely worsened due to E. coli UTI -Appears alert and much oriented today, however getting deconditioned and depressed.  Patient states she has nothing to look forward to anymore.  Discussed with patient's son on the phone, he reports that she has been saying it for few years now.   -Patient was placed on IV Rocephin, for E. coli UTI.   -Transitioned to oral cefdinir on 5/21.   Hypertension -New diagnosis, not on any home meds -Continue Norvasc 10 mg daily, hydralazine as needed  Generalized  debility, fall -Likely mechanical versus UTI related, work-up in ED negative -Per patient's son has been falling almost every week for last few months -Bed available [facility], SNF, COVID-19 test ordered, they will take her tomorrow, facility confirmed by SW  Dementia Has not been taking home meds Aricept, Namenda, resume  FEN IV fluids discontinued IV fluids DC'd, encourage p.o. diet   Code Status: DNR DV IV fluids DC'd, encourage p.o. dietT Prophylaxis:  Lovenox  Family Communication: Discussed in detail with patient's son, Patrick Jupiter on the phone  Disposition Plan:     Status is: Inpatient  Remains inpatient appropriate because:Inpatient level of care appropriate due to severity of illness   Dispo: The patient is from: Home              Anticipated d/c is to: SNF              Anticipated d/c date is: 1 day              Patient currently is medically stable to d/c.  Awaiting COVID-19 test, bed availability confirmed  at [facility] for tomorrow       Time Spent in minutes   35 minutes  Procedures:  None  Consultants:   None  Antimicrobials:   Anti-infectives (From admission, onward)   Start     Dose/Rate Route Frequency Ordered Stop   10/05/19 1400  cefdinir (OMNICEF) capsule 300 mg     300 mg Oral Every 12 hours 10/05/19 1239     10/03/19 1400  cefTRIAXone (ROCEPHIN) 1 g in sodium chloride 0.9 % 100 mL IVPB  Status:  Discontinued     1 g 200 mL/hr over 30 Minutes Intravenous Every 24 hours 10/02/19 1901 10/05/19 1239   10/02/19 1315  cefTRIAXone (ROCEPHIN) 1 g in sodium chloride 0.9 % 100 mL IVPB     1 g 200 mL/hr over 30 Minutes Intravenous  Once 10/02/19 1309 10/02/19 1418         Medications  Scheduled Meds: . acetaminophen  500 mg Oral BID  . amLODipine  10 mg Oral Daily  . cefdinir  300 mg Oral Q12H  . donepezil  10 mg Oral QHS  . enoxaparin (LOVENOX) injection  40 mg Subcutaneous Q24H  . memantine  10 mg Oral BID   Continuous  Infusions:  PRN Meds:.acetaminophen **OR** acetaminophen, hydrALAZINE, hydrALAZINE, ondansetron **OR** ondansetron (ZOFRAN) IV      Subjective:   Alicia Harrison was seen and examined today.  Alert and oriented however somewhat depressed.  No acute issues overnight.  No fevers or chills, nausea vomiting abdominal pain or any diarrhea.    Objective:   Vitals:   10/06/19 2017 10/07/19 0458 10/07/19 0909 10/07/19 1148  BP: (!) 142/76 (!) 171/89 (!) 136/109 (!) 142/69  Pulse: 76 88 87 85  Resp: 18 16  18   Temp: 98.7 F (37.1 C) 98.6 F (37 C)  98.1 F (36.7 C)  TempSrc: Oral Oral  Oral  SpO2: 98% 98%  96%  Weight:      Height:        Intake/Output Summary (Last 24 hours) at 10/07/2019 1251 Last data filed at 10/07/2019 1000 Gross per 24 hour  Intake 840 ml  Output 450 ml  Net 390 ml     Wt Readings from Last 3 Encounters:  10/03/19 61.7 kg  08/18/19 48.7 kg  05/03/17 48.7 kg    Physical Exam  General: Much more alert and oriented today, appropriately answering questions  Cardiovascular: S1 S2 clear, RRR. No pedal edema b/l  Respiratory: CTAB, no wheezing, rales or rhonchi  Gastrointestinal: Soft, nontender, nondistended, NBS  Ext: no pedal edema bilaterally  Neuro: no new deficits  Musculoskeletal: No cyanosis, clubbing  Skin: No rashes  Psych: Appears to be a bit depressed, alert and oriented x3    Data Reviewed:  I have personally reviewed following labs and imaging studies  Micro Results Recent Results (from the past 240 hour(s))  Urine culture     Status: Abnormal   Collection Time: 10/02/19 11:49 AM   Specimen: Urine, Clean Catch  Result Value Ref Range Status   Specimen Description   Final    URINE, CLEAN CATCH Performed at [facility] [address] [place], [place] [address]    Special Requests   Final    NONE Performed at [facility], [place] [address] [place], [place] [address]    Culture  >=100,000 COLONIES/mL ESCHERICHIA COLI (A)  Final   Report Status 10/04/2019 FINAL  Final   Organism ID, Bacteria ESCHERICHIA COLI (  A)  Final      Susceptibility   Escherichia coli - MIC*    AMPICILLIN 4 SENSITIVE Sensitive     CEFAZOLIN <=4 SENSITIVE Sensitive     CEFTRIAXONE <=1 SENSITIVE Sensitive     CIPROFLOXACIN <=0.25 SENSITIVE Sensitive     GENTAMICIN <=1 SENSITIVE Sensitive     IMIPENEM <=0.25 SENSITIVE Sensitive     NITROFURANTOIN <=16 SENSITIVE Sensitive     TRIMETH/SULFA <=20 SENSITIVE Sensitive     AMPICILLIN/SULBACTAM <=2 SENSITIVE Sensitive     PIP/TAZO <=4 SENSITIVE Sensitive     * >=100,000 COLONIES/mL ESCHERICHIA COLI  SARS Coronavirus 2 by RT PCR (hospital order, performed in [facility] hospital lab) Nasopharyngeal Nasopharyngeal Swab     Status: None   Collection Time: 10/02/19  1:09 PM   Specimen: Nasopharyngeal Swab  Result Value Ref Range Status   SARS Coronavirus 2 NEGATIVE NEGATIVE Final    Comment: (NOTE) SARS-CoV-2 target nucleic acids are NOT DETECTED. The SARS-CoV-2 RNA is generally detectable in upper and lower respiratory specimens during the acute phase of infection. The lowest concentration of SARS-CoV-2 viral copies this assay can detect is 250 copies / mL. A negative result does not preclude SARS-CoV-2 infection and should not be used as the sole basis for treatment or other patient management decisions.  A negative result may occur with improper specimen collection / handling, submission of specimen other than nasopharyngeal swab, presence of viral mutation(s) within the areas targeted by this assay, and inadequate number of viral copies (<250 copies / mL). A negative result must be combined with clinical observations, patient history, and epidemiological information. Fact Sheet for Patients:   StrictlyIdeas.no Fact Sheet for Healthcare Providers: BankingDealers.co.za This test is not yet approved  or cleared  by the [place] FDA and has been authorized for detection and/or diagnosis of SARS-CoV-2 by FDA under an Emergency Use Authorization (EUA).  This EUA will remain in effect (meaning this test can be used) for the duration of the COVID-19 declaration under Section 564(b)(1) of the Act, 21 U.S.C. section 360bbb-3(b)(1), unless the authorization is terminated or revoked sooner. Performed at [facility], [place] [address], [place], [place] [address]     Radiology Reports CT Head Wo Contrast  Result Date: 10/02/2019 CLINICAL DATA:  Recent fall EXAM: CT HEAD WITHOUT CONTRAST CT CERVICAL SPINE WITHOUT CONTRAST TECHNIQUE: Multidetector CT imaging of the head and cervical spine was performed following the standard protocol without intravenous contrast. Multiplanar CT image reconstructions of the cervical spine were also generated. COMPARISON:  08/18/2019 FINDINGS: CT HEAD FINDINGS Brain: Mild atrophic changes and chronic white matter ischemic changes are noted. No findings to suggest acute hemorrhage, acute infarction or space-occupying mass lesion are seen. Vascular: No hyperdense vessel or unexpected calcification. Skull: Normal. Negative for fracture or focal lesion. Sinuses/Orbits: No acute finding. Other: None. CT CERVICAL SPINE FINDINGS Alignment: Mild anterolisthesis of C4 on C5 of a degenerative nature is noted. Anterolisthesis of C7 on T1 is noted also of a degenerative nature. Skull base and vertebrae: 7 cervical segments are well visualized. Vertebral body height is well maintained. The odontoid is within normal limits. Disc space narrowing is seen from C5-C7. Mild associated osteophytic changes are noted. Multilevel facet hypertrophic changes are seen. No acute fracture is noted. Soft tissues and spinal canal: Surrounding soft tissue structures appear within normal limits. Upper chest: Visualized lung apices are unremarkable. Other: None IMPRESSION: CT of the head:  Chronic atrophic and ischemic changes without acute abnormality. CT  of the cervical spine: Multilevel degenerative change with mild anterolisthesis. No acute abnormality noted. Electronically Signed   By: Inez Catalina M.D.   On: 10/02/2019 12:08   CT Cervical Spine Wo Contrast  Result Date: 10/02/2019 CLINICAL DATA:  Recent fall EXAM: CT HEAD WITHOUT CONTRAST CT CERVICAL SPINE WITHOUT CONTRAST TECHNIQUE: Multidetector CT imaging of the head and cervical spine was performed following the standard protocol without intravenous contrast. Multiplanar CT image reconstructions of the cervical spine were also generated. COMPARISON:  08/18/2019 FINDINGS: CT HEAD FINDINGS Brain: Mild atrophic changes and chronic white matter ischemic changes are noted. No findings to suggest acute hemorrhage, acute infarction or space-occupying mass lesion are seen. Vascular: No hyperdense vessel or unexpected calcification. Skull: Normal. Negative for fracture or focal lesion. Sinuses/Orbits: No acute finding. Other: None. CT CERVICAL SPINE FINDINGS Alignment: Mild anterolisthesis of C4 on C5 of a degenerative nature is noted. Anterolisthesis of C7 on T1 is noted also of a degenerative nature. Skull base and vertebrae: 7 cervical segments are well visualized. Vertebral body height is well maintained. The odontoid is within normal limits. Disc space narrowing is seen from C5-C7. Mild associated osteophytic changes are noted. Multilevel facet hypertrophic changes are seen. No acute fracture is noted. Soft tissues and spinal canal: Surrounding soft tissue structures appear within normal limits. Upper chest: Visualized lung apices are unremarkable. Other: None IMPRESSION: CT of the head: Chronic atrophic and ischemic changes without acute abnormality. CT of the cervical spine: Multilevel degenerative change with mild anterolisthesis. No acute abnormality noted. Electronically Signed   By: Inez Catalina M.D.   On: 10/02/2019 12:08   DG  Pelvis Portable  Result Date: 10/02/2019 CLINICAL DATA:  Recent fall with hip pain, initial encounter EXAM: PORTABLE PELVIS 1 VIEWS COMPARISON:  None. FINDINGS: Pelvic ring is intact. No acute fracture is noted. No soft tissue abnormality is seen. IMPRESSION: No acute abnormality noted. Electronically Signed   By: Inez Catalina M.D.   On: 10/02/2019 11:47   DG Chest Portable 1 View  Result Date: 10/02/2019 CLINICAL DATA:  Fall EXAM: PORTABLE CHEST 1 VIEW COMPARISON:  08/18/2019 FINDINGS: The heart size and mediastinal contours are within normal limits. Both lungs are clear. No pleural effusion or pneumothorax. The visualized skeletal structures are grossly intact. Advanced degenerative changes at the left glenohumeral joint. IMPRESSION: No acute process in the chest. Electronically Signed   By: Macy Mis M.D.   On: 10/02/2019 11:46    Lab Data:  CBC: Recent Labs  Lab 10/02/19 1228 10/03/19 0436 10/07/19 0456  WBC 11.0* 8.8 8.2  NEUTROABS 9.6*  --   --   HGB 13.0 15.9* 12.3  HCT 39.5 47.7* 37.5  MCV 93.8 92.1 93.1  PLT 243 169 [id number]   Basic Metabolic Panel: Recent Labs  Lab 10/02/19 1228 10/03/19 0436 10/07/19 0456  NA 139 138 141  K 4.4 3.8 3.5  CL 103 106 109  CO2 26 23 25   GLUCOSE 129* 124* 92  BUN 26* 17 14  CREATININE 1.18* 1.00 1.14*  CALCIUM 9.3 8.6* 8.8*   GFR: Estimated Creatinine Clearance: 31.3 mL/min (A) (by C-G formula based on SCr of 1.14 mg/dL (H)). Liver Function Tests: Recent Labs  Lab 10/03/19 0436  AST 25  ALT 16  ALKPHOS 71  BILITOT 0.9  PROT 6.3*  ALBUMIN 3.5   No results for input(s): LIPASE, AMYLASE in the last 168 hours. No results for input(s): AMMONIA in the last 168 hours. Coagulation Profile: No results for input(s):  INR, PROTIME in the last 168 hours. Cardiac Enzymes: No results for input(s): CKTOTAL, CKMB, CKMBINDEX, TROPONINI in the last 168 hours. BNP (last 3 results) No results for input(s): PROBNP in the last 8760  hours. HbA1C: No results for input(s): HGBA1C in the last 72 hours. CBG: No results for input(s): GLUCAP in the last 168 hours. Lipid Profile: No results for input(s): CHOL, HDL, LDLCALC, TRIG, CHOLHDL, LDLDIRECT in the last 72 hours. Thyroid Function Tests: No results for input(s): TSH, T4TOTAL, FREET4, T3FREE, THYROIDAB in the last 72 hours. Anemia Panel: No results for input(s): VITAMINB12, FOLATE, FERRITIN, TIBC, IRON, RETICCTPCT in the last 72 hours. Urine analysis:    Component Value Date/Time   COLORURINE YELLOW 10/02/2019 1149   APPEARANCEUR HAZY (A) 10/02/2019 1149   LABSPEC 1.016 10/02/2019 1149   PHURINE 7.0 10/02/2019 1149   GLUCOSEU NEGATIVE 10/02/2019 1149   HGBUR MODERATE (A) 10/02/2019 1149   BILIRUBINUR NEGATIVE 10/02/2019 1149   KETONESUR 5 (A) 10/02/2019 1149   PROTEINUR 100 (A) 10/02/2019 1149   NITRITE POSITIVE (A) 10/02/2019 1149   LEUKOCYTESUR NEGATIVE 10/02/2019 1149     Nikkole Placzek M.D. [facility] 10/07/2019, 12:51 PM   Call night coverage person covering after 7pm

## 2019-10-08 LAB — BASIC METABOLIC PANEL
Anion gap: 8 (ref 5–15)
BUN: 19 mg/dL (ref 8–23)
CO2: 25 mmol/L (ref 22–32)
Calcium: 8.6 mg/dL — ABNORMAL LOW (ref 8.9–10.3)
Chloride: 107 mmol/L (ref 98–111)
Creatinine, Ser: 0.98 mg/dL (ref 0.44–1.00)
GFR calc Af Amer: 58 mL/min — ABNORMAL LOW (ref >60)
GFR calc non Af Amer: 50 mL/min — ABNORMAL LOW (ref >60)
Glucose, Bld: 101 mg/dL — ABNORMAL HIGH (ref 70–99)
Potassium: 3.1 mmol/L — ABNORMAL LOW (ref 3.5–5.1)
Sodium: 140 mmol/L (ref 135–145)

## 2019-10-08 MED ORDER — CEFDINIR 300 MG PO CAPS: 300.0000 mg | ORAL_CAPSULE | Freq: Two times a day (BID) | ORAL | Status: DC

## 2019-10-08 MED ORDER — AMLODIPINE BESYLATE 10 MG PO TABS: 10.0000 mg | ORAL_TABLET | Freq: Every day | ORAL | Status: AC

## 2019-10-08 NOTE — Discharge Summary (Signed)
Physician Discharge Summary   Patient ID: Alicia Harrison MRN: [id number] DOB/AGE: Nov 25, 1925 86 y.o.  Admit date: 10/02/2019 Discharge date: 10/08/2019  Primary Care Physician:  Haywood Pao, MD   Recommendations for Outpatient Follow-up:  1. Follow up with PCP in 1-2 weeks  Home Health: Patient being discharged to skilled nursing facility Equipment/Devices:   Discharge Condition: stable  CODE STATUS: FULL or DNR   Diet recommendation: Soft diet   Discharge Diagnoses:     Acute metabolic encephalopathy superimposed on dementia E. coli UTI Essential hypertension, new diagnosis Generalized debility with falls Dementia   Consults: None    Allergies:  No Known Allergies   DISCHARGE MEDICATIONS: Allergies as of 10/08/2019   No Known Allergies     Medication List    TAKE these medications   acetaminophen 325 MG tablet Commonly known as: TYLENOL Take 325-650 mg by mouth every 6 (six) hours as needed for mild pain, moderate pain or headache.   amLODipine 10 MG tablet Commonly known as: NORVASC Take 1 tablet (10 mg total) by mouth daily.   cefdinir 300 MG capsule Commonly known as: OMNICEF Take 1 capsule (300 mg total) by mouth 2 (two) times daily for 1 day. Last dose for 1 more day   donepezil 10 MG tablet Commonly known as: ARICEPT Take 1 tablet (10 mg total) by mouth at bedtime.   memantine 10 MG tablet Commonly known as: Namenda Take 1 tablet (10 mg total) by mouth 2 (two) times daily.        Brief H and P: For complete details please refer to admission H and P, but in brief  Admit date:10/02/2019 86 y.o.female,with history of dementia who uses walker and a cane at baseline, brought in by son after an unwitnessed fall / found on the floor with AMS. Usually she is able to carry out a conversation but has not been able to talk.EMS was called and patient brought to the ED for further evaluation.Home health aides take care of the patient but  son also lives on property and helps take care of her ED Course: Afebrile, BP elevated at systolic 123456 to [id number], diastolic in [id number], WBC [id number], renal function okay, found to have abnormal UA, started on IV ceftriaxone. Imaging studies including CT head and CT cervical spine was negative. CXR, pelvic x-ray was unremarkable.    Hospital Course:  Acute metabolic encephalopathy, superimposed on dementia -Likely worsened due to E. coli UTI -Alert and oriented, appears close to her baseline.    E. coli UTI Urine culture showed E. coli, patient was initially placed on IV Rocephin, transitioned to oral cefdinir on 5/21, continue for 1 day for last dose of treatment  Hypertension -New diagnosis, not on any home meds prior to admission -Continue Norvasc 10 mg daily  Generalized debility, fall -Likely mechanical versus UTI related, work-up in ED negative -Per patient's son has been falling almost every week for last few months -Bed available at [facility] SN[facility],  -COVID-19 test 10/07/2019 negative  Dementia Has not been taking home meds Aricept, Namenda, resumed    Day of Discharge S: No acute complaints, slept well last night, wants to go home.  No fevers or chills, no pain.  BP (!) 163/70 (BP Location: Left Arm)   Pulse 69   Temp 98.9 F (37.2 C)   Resp 18   Ht 5\' 7"  (1.702 m)   Wt 61.7 kg   LMP 05/17/1988   SpO2 98%   BMI 21.30  kg/m   Physical Exam: General: Alert and awake oriented, NAD HEENT: anicteric sclera, pupils reactive to light and accommodation CVS: S1-S2 clear no murmur rubs or gallops Chest: clear to auscultation bilaterally, no wheezing rales or rhonchi Abdomen: soft nontender, nondistended, normal bowel sounds Extremities: no cyanosis, clubbing or edema noted bilaterally Neuro: No new deficits    Get Medicines reviewed and adjusted: Please take all your medications with you for your next visit with your Primary MD  Please request your Primary MD to go  over all hospital tests and procedure/radiological results at the follow up. Please ask your Primary MD to get all Hospital records sent to his/her office.  If you experience worsening of your admission symptoms, develop shortness of breath, life threatening emergency, suicidal or homicidal thoughts you must seek medical attention immediately by calling 911 or calling your MD immediately  if symptoms less severe.  You must read complete instructions/literature along with all the possible adverse reactions/side effects for all the Medicines you take and that have been prescribed to you. Take any new Medicines after you have completely understood and accept all the possible adverse reactions/side effects.   Do not drive when taking pain medications.   Do not take more than prescribed Pain, Sleep and Anxiety Medications  Special Instructions: If you have smoked or chewed Tobacco  in the last 2 yrs please stop smoking, stop any regular Alcohol  and or any Recreational drug use.  Wear Seat belts while driving.  Please note  You were cared for by a hospitalist during your hospital stay. Once you are discharged, your primary care physician will handle any further medical issues. Please note that NO REFILLS for any discharge medications will be authorized once you are discharged, as it is imperative that you return to your primary care physician (or establish a relationship with a primary care physician if you do not have one) for your aftercare needs so that they can reassess your need for medications and monitor your lab values.   The results of significant diagnostics from this hospitalization (including imaging, microbiology, ancillary and laboratory) are listed below for reference.      Procedures/Studies:  CT Head Wo Contrast  Result Date: 10/02/2019 CLINICAL DATA:  Recent fall EXAM: CT HEAD WITHOUT CONTRAST CT CERVICAL SPINE WITHOUT CONTRAST TECHNIQUE: Multidetector CT imaging of the head  and cervical spine was performed following the standard protocol without intravenous contrast. Multiplanar CT image reconstructions of the cervical spine were also generated. COMPARISON:  08/18/2019 FINDINGS: CT HEAD FINDINGS Brain: Mild atrophic changes and chronic white matter ischemic changes are noted. No findings to suggest acute hemorrhage, acute infarction or space-occupying mass lesion are seen. Vascular: No hyperdense vessel or unexpected calcification. Skull: Normal. Negative for fracture or focal lesion. Sinuses/Orbits: No acute finding. Other: None. CT CERVICAL SPINE FINDINGS Alignment: Mild anterolisthesis of C4 on C5 of a degenerative nature is noted. Anterolisthesis of C7 on T1 is noted also of a degenerative nature. Skull base and vertebrae: 7 cervical segments are well visualized. Vertebral body height is well maintained. The odontoid is within normal limits. Disc space narrowing is seen from C5-C7. Mild associated osteophytic changes are noted. Multilevel facet hypertrophic changes are seen. No acute fracture is noted. Soft tissues and spinal canal: Surrounding soft tissue structures appear within normal limits. Upper chest: Visualized lung apices are unremarkable. Other: None IMPRESSION: CT of the head: Chronic atrophic and ischemic changes without acute abnormality. CT of the cervical spine: Multilevel degenerative change with  mild anterolisthesis. No acute abnormality noted. Electronically Signed   By: Inez Catalina M.D.   On: 10/02/2019 12:08   CT Cervical Spine Wo Contrast  Result Date: 10/02/2019 CLINICAL DATA:  Recent fall EXAM: CT HEAD WITHOUT CONTRAST CT CERVICAL SPINE WITHOUT CONTRAST TECHNIQUE: Multidetector CT imaging of the head and cervical spine was performed following the standard protocol without intravenous contrast. Multiplanar CT image reconstructions of the cervical spine were also generated. COMPARISON:  08/18/2019 FINDINGS: CT HEAD FINDINGS Brain: Mild atrophic changes and  chronic white matter ischemic changes are noted. No findings to suggest acute hemorrhage, acute infarction or space-occupying mass lesion are seen. Vascular: No hyperdense vessel or unexpected calcification. Skull: Normal. Negative for fracture or focal lesion. Sinuses/Orbits: No acute finding. Other: None. CT CERVICAL SPINE FINDINGS Alignment: Mild anterolisthesis of C4 on C5 of a degenerative nature is noted. Anterolisthesis of C7 on T1 is noted also of a degenerative nature. Skull base and vertebrae: 7 cervical segments are well visualized. Vertebral body height is well maintained. The odontoid is within normal limits. Disc space narrowing is seen from C5-C7. Mild associated osteophytic changes are noted. Multilevel facet hypertrophic changes are seen. No acute fracture is noted. Soft tissues and spinal canal: Surrounding soft tissue structures appear within normal limits. Upper chest: Visualized lung apices are unremarkable. Other: None IMPRESSION: CT of the head: Chronic atrophic and ischemic changes without acute abnormality. CT of the cervical spine: Multilevel degenerative change with mild anterolisthesis. No acute abnormality noted. Electronically Signed   By: Inez Catalina M.D.   On: 10/02/2019 12:08   DG Pelvis Portable  Result Date: 10/02/2019 CLINICAL DATA:  Recent fall with hip pain, initial encounter EXAM: PORTABLE PELVIS 1 VIEWS COMPARISON:  None. FINDINGS: Pelvic ring is intact. No acute fracture is noted. No soft tissue abnormality is seen. IMPRESSION: No acute abnormality noted. Electronically Signed   By: Inez Catalina M.D.   On: 10/02/2019 11:47   DG Chest Portable 1 View  Result Date: 10/02/2019 CLINICAL DATA:  Fall EXAM: PORTABLE CHEST 1 VIEW COMPARISON:  08/18/2019 FINDINGS: The heart size and mediastinal contours are within normal limits. Both lungs are clear. No pleural effusion or pneumothorax. The visualized skeletal structures are grossly intact. Advanced degenerative changes at  the left glenohumeral joint. IMPRESSION: No acute process in the chest. Electronically Signed   By: Macy Mis M.D.   On: 10/02/2019 11:46       LAB RESULTS: Basic Metabolic Panel: Recent Labs  Lab 10/07/19 0456 10/08/19 0814  NA 141 140  K 3.5 3.1*  CL 109 107  CO2 25 25  GLUCOSE 92 101*  BUN 14 19  CREATININE 1.14* 0.98  CALCIUM 8.8* 8.6*   Liver Function Tests: Recent Labs  Lab 10/03/19 0436  AST 25  ALT 16  ALKPHOS 71  BILITOT 0.9  PROT 6.3*  ALBUMIN 3.5   No results for input(s): LIPASE, AMYLASE in the last 168 hours. No results for input(s): AMMONIA in the last 168 hours. CBC: Recent Labs  Lab 10/02/19 1228 10/02/19 1228 10/03/19 0436 10/03/19 0436 10/07/19 0456  WBC 11.0*   < > 8.8  --  8.2  NEUTROABS 9.6*  --   --   --   --   HGB 13.0   < > 15.9*  --  12.3  HCT 39.5   < > 47.7*  --  37.5  MCV 93.8   < > 92.1   < > 93.1  PLT 243   < >  169  --  260   < > = values in this interval not displayed.   Cardiac Enzymes: No results for input(s): CKTOTAL, CKMB, CKMBINDEX, TROPONINI in the last 168 hours. BNP: Invalid input(s): POCBNP CBG: No results for input(s): GLUCAP in the last 168 hours.     Disposition and Follow-up: Discharge Instructions    Diet - low sodium heart healthy   Complete by: As directed    Increase activity slowly   Complete by: As directed        DISPOSITION: [place]    Tisovec, Fransico Him, MD Follow up in 2 week(s).   Specialty: Internal Medicine Contact information: [address] [place] [address] [phone]            Time coordinating discharge:  35 minutes  Signed:   Estill Cotta M.D. [facility] 10/08/2019, 10:25 AM

## 2019-10-08 NOTE — Progress Notes (Signed)
Physical Therapy Treatment Patient Details Name: Alicia Harrison MRN: [id number] DOB: 1925-08-15 Today's Date: 10/08/2019    History of Present Illness Alicia Harrison  is a 86 y.o. female, with history of dementia, had unwitnessed fall at home , confused and unable tio converse. In the ED 10/02/19 patient was found to have abnormal UA,  Imaging studies including CT head and CT cervical spine was negative.  Chest x-ray was unremarkable.  Pelvic x-ray was unremarkable. At baseline patient uses walker and a cane.  Home health aides take care of the patient but son also lives on property and helps take care of her.    PT Comments    Pt ambulated to bathroom and then assisted back to bed.  Pt reports desire to "go home" and tearful at times due to "not wanting to be here."  Recommend 24/7 assist at home for safety.  Pt may benefit form SNF.   Follow Up Recommendations  SNF;Supervision/Assistance - 24 hour     Equipment Recommendations  None recommended by PT    Recommendations for Other Services       Precautions / Restrictions Precautions Precautions: Fall    Mobility  Bed Mobility Overal bed mobility: Needs Assistance Bed Mobility: Supine to Sit;Sit to Supine     Supine to sit: Min assist Sit to supine: Min assist   General bed mobility comments: multimodal cues for sequencing; assist for trunk upright and LEs onto bed  Transfers Overall transfer level: Needs assistance Equipment used: Rolling walker (2 wheeled) Transfers: Sit to/from Stand Sit to Stand: Min assist         General transfer comment: assist to rise and steady, cues for hand placement  Ambulation/Gait Ambulation/Gait assistance: Min assist;Min guard Gait Distance (Feet): 16 Feet Assistive device: Rolling walker (2 wheeled) Gait Pattern/deviations: Step-through pattern;Decreased stride length;Trunk flexed;Narrow base of support Gait velocity: decreased   General Gait Details: ambulated to/from  bathroom; cues for posture and RW positioning   Stairs             Wheelchair Mobility    Modified Rankin (Stroke Patients Only)       Balance Overall balance assessment: History of Falls         Standing balance support: Bilateral upper extremity supported Standing balance-Leahy Scale: Poor Standing balance comment: requires external support                            Cognition Arousal/Alertness: Awake/alert Behavior During Therapy: WFL for tasks assessed/performed Overall Cognitive Status: No family/caregiver present to determine baseline cognitive functioning Area of Impairment: Orientation;Following commands;Problem solving                 Orientation Level: Time;Situation     Following Commands: Follows one step commands with increased time     Problem Solving: Slow processing;Decreased initiation;Difficulty sequencing General Comments: oriented to , multimodal cues and reminders to continue tasks; asks same questions - poor memory      Exercises      General Comments        Pertinent Vitals/Pain Pain Assessment: No/denies pain    Home Living                      Prior Function            PT Goals (current goals can now be found in the care plan section) Progress towards PT goals: Progressing toward goals  Frequency    Min 2X/week      PT Plan Current plan remains appropriate    Co-evaluation              AM-PAC PT "6 Clicks" Mobility   Outcome Measure  Help needed turning from your back to your side while in a flat bed without using bedrails?: A Little Help needed moving from lying on your back to sitting on the side of a flat bed without using bedrails?: A Little Help needed moving to and from a bed to a chair (including a wheelchair)?: A Little Help needed standing up from a chair using your arms (e.g., wheelchair or bedside chair)?: A Little Help needed to walk in hospital room?: A  Little Help needed climbing 3-5 steps with a railing? : A Lot 6 Click Score: 17    End of Session Equipment Utilized During Treatment: Gait belt Activity Tolerance: Patient tolerated treatment well Patient left: in bed;with call bell/phone within reach;with bed alarm set Nurse Communication: Mobility status PT Visit Diagnosis: Unsteadiness on feet (R26.81);Difficulty in walking, not elsewhere classified (R26.2);History of falling (Z91.81)     Time: 1010-1035 PT Time Calculation (min) (ACUTE ONLY): 25 min  Charges:  $Gait Training: 8-22 mins                     Arlyce Dice, DPT Acute Rehabilitation Services Office: [phone]  Trena Platt 10/08/2019, 12:44 PM

## 2019-10-08 NOTE — TOC Progression Note (Signed)
Transition of Care [facility]) - Progression Note    Patient Details  Name: Alicia Harrison MRN: [id number] Date of Birth: [birth year]  Transition of Care [facility]) CM/SW Contact  Deylan Canterbury, Juliann Pulse, RN Phone Number: 10/08/2019, 11:06 AM  Clinical Narrative: [facility] chosen-rep Irine Seal has 1  Non isolation bed available & no isolation beds available.Waiting on auth from [facility] medicare,received pasrr.d/c summary.      Expected Discharge Plan: Skilled Nursing Facility Barriers to Discharge: Insurance Authorization  Expected Discharge Plan and Services Expected Discharge Plan: [place]   Discharge Planning Services: CM Consult   Living arrangements for the past 2 months: Single Family Home Expected Discharge Date: 10/08/19                                     Social Determinants of Health (SDOH) Interventions    Readmission Risk Interventions No flowsheet data found.

## 2019-10-08 NOTE — Progress Notes (Signed)
Occupational Therapy Treatment Patient Details Name: Alicia Harrison MRN: [id number] DOB: 13-May-1926 Today's Date: 10/08/2019    History of present illness Alicia Harrison  is a 86 y.o. female, with history of dementia, had unwitnessed fall at home , confused and unable tio converse. In the ED 10/02/19 patient was found to have abnormal UA,  Imaging studies including CT head and CT cervical spine was negative.  Chest x-ray was unremarkable.  Pelvic x-ray was unremarkable. At baseline patient uses walker and a cane.  Home health aides take care of the patient but son also lives on property and helps take care of her.   OT comments    Follow Up Recommendations  Home health OT;Supervision/Assistance - 24 hour;SNF(if family unable to provide 24 hour supervision - SNF)    Equipment Recommendations  3 in 1 bedside commode    Recommendations for Other Services      Precautions / Restrictions Precautions Precautions: Fall       Mobility Bed Mobility Overal bed mobility: Needs Assistance Bed Mobility: Supine to Sit;Sit to Supine     Supine to sit: Min assist Sit to supine: Min assist   General bed mobility comments: multimodal cues for sequencing; assist for trunk upright and LEs onto bed  Transfers Overall transfer level: Needs assistance Equipment used: Rolling walker (2 wheeled) Transfers: Sit to/from Stand Sit to Stand: Min assist         General transfer comment: assist to rise and steady, cues for hand placement    Balance Overall balance assessment: History of Falls         Standing balance support: Bilateral upper extremity supported Standing balance-Leahy Scale: Poor Standing balance comment: requires external support                           ADL either performed or assessed with clinical judgement   ADL Overall ADL's : Needs assistance/impaired Eating/Feeding: Minimal assistance;Sitting   Grooming: Wash/dry hands;Wash/dry face;Minimal  assistance;Sitting;Oral care                                 General ADL Comments: Sitting EOB for lunch.  Pt sat briefly and wanted to lie back down and finish lunch               Cognition Arousal/Alertness: Awake/alert Behavior During Therapy: WFL for tasks assessed/performed Overall Cognitive Status: No family/caregiver present to determine baseline cognitive functioning Area of Impairment: Orientation;Following commands;Problem solving                 Orientation Level: Time;Situation     Following Commands: Follows one step commands with increased time     Problem Solving: Slow processing;Decreased initiation;Difficulty sequencing General Comments: oriented to , multimodal cues and reminders to continue tasks; asks same questions - poor memory                   Pertinent Vitals/ Pain       Pain Assessment: No/denies pain Pain Score: 3  Pain Location: L shoulder Pain Descriptors / Indicators: Discomfort;Grimacing     Prior Functioning/Environment              Frequency  Min 2X/week        Progress Toward Goals  OT Goals(current goals can now be found in the care plan section)  Progress towards OT goals: Progressing toward goals  Plan Discharge plan remains appropriate    Co-evaluation                 AM-PAC OT "6 Clicks" Daily Activity     Outcome Measure   Help from another person eating meals?: A Little Help from another person taking care of personal grooming?: A Little Help from another person toileting, which includes using toliet, bedpan, or urinal?: A Lot Help from another person bathing (including washing, rinsing, drying)?: A Lot Help from another person to put on and taking off regular upper body clothing?: A Lot Help from another person to put on and taking off regular lower body clothing?: A Lot 6 Click Score: 14    End of Session    OT Visit Diagnosis: Unsteadiness on feet (R26.81);Other  abnormalities of gait and mobility (R26.89);History of falling (Z91.81);Other symptoms and signs involving cognitive function   Activity Tolerance Patient tolerated treatment well;Patient limited by fatigue   Patient Left with call bell/phone within reach;in bed;with bed alarm set   Nurse Communication Mobility status        Time: [id number] OT Time Calculation (min): 17 min  Charges: OT General Charges $OT Visit: 1 Visit OT Treatments $Self Care/Home Management : 8-22 mins  Kari Baars, [place] [phone] Office- [phone]      Long, Edwena Felty D 10/08/2019, 3:53 PM

## 2019-10-09 LAB — BASIC METABOLIC PANEL
Anion gap: 7 (ref 5–15)
Anion gap: 8 (ref 5–15)
BUN: 30 mg/dL — ABNORMAL HIGH (ref 8–23)
BUN: 27 mg/dL — ABNORMAL HIGH (ref 8–23)
CO2: 26 mmol/L (ref 22–32)
CO2: 25 mmol/L (ref 22–32)
Calcium: 8.8 mg/dL — ABNORMAL LOW (ref 8.9–10.3)
Calcium: 8.4 mg/dL — ABNORMAL LOW (ref 8.9–10.3)
Chloride: 109 mmol/L (ref 98–111)
Chloride: 110 mmol/L (ref 98–111)
Creatinine, Ser: 1.4 mg/dL — ABNORMAL HIGH (ref 0.44–1.00)
Creatinine, Ser: 1.29 mg/dL — ABNORMAL HIGH (ref 0.44–1.00)
GFR calc Af Amer: 38 mL/min — ABNORMAL LOW (ref >60)
GFR calc Af Amer: 42 mL/min — ABNORMAL LOW (ref >60)
GFR calc non Af Amer: 33 mL/min — ABNORMAL LOW (ref >60)
GFR calc non Af Amer: 36 mL/min — ABNORMAL LOW (ref >60)
Glucose, Bld: 100 mg/dL — ABNORMAL HIGH (ref 70–99)
Glucose, Bld: 99 mg/dL (ref 70–99)
Potassium: 3.1 mmol/L — ABNORMAL LOW (ref 3.5–5.1)
Potassium: 3.6 mmol/L (ref 3.5–5.1)
Sodium: 142 mmol/L (ref 135–145)
Sodium: 143 mmol/L (ref 135–145)

## 2019-10-09 MED ORDER — POTASSIUM CHLORIDE CRYS ER 20 MEQ PO TBCR
40.0000 meq | EXTENDED_RELEASE_TABLET | Freq: Once | ORAL | Status: AC
  Administered 2019-10-09: 40 meq via ORAL
  Filled 2019-10-09: qty 2

## 2019-10-09 MED ORDER — SODIUM CHLORIDE 0.9 % IV SOLN: INTRAVENOUS | Status: DC

## 2019-10-09 NOTE — TOC Progression Note (Signed)
Transition of Care [facility]) - Progression Note    Patient Details  Name: NAREH YOUNGHANS MRN: [id number] Date of Birth: 1925/05/19  [facility]) CM/SW Contact  Naomii Kreger, Juliann Pulse, RN Phone Number: 10/09/2019, 10:18 AM  Clinical Narrative:  F/u on auth-spoke to Denise-states she didn't receive all info & left vm w/ csw from Sunday-informed her that we have weekend staff which is different from weekday staff. Faxed w/confirmation additional info-PT eval 5/19, d/c summary, PT note 5/24-await response for auth.[facility] has a bed. covid neg 5/23.MD updated.     Expected Discharge Plan: Skilled Nursing Facility Barriers to Discharge: Insurance Authorization  Expected Discharge Plan and Services Expected Discharge Plan: [place]   Discharge Planning Services: CM Consult   Living arrangements for the past 2 months: Single Family Home Expected Discharge Date: 10/08/19                                     Social Determinants of Health (SDOH) Interventions    Readmission Risk Interventions No flowsheet data found.

## 2019-10-09 NOTE — TOC Transition Note (Signed)
Transition of Care [facility]) - CM/SW Discharge Note   Patient Details  Name: Alicia Harrison MRN: [id number] Date of Birth: Jan 07, 1926  Transition of Care [facility]) CM/SW Contact:  Dessa Phi, RN Phone Number: 10/09/2019, 1:01 PM   Clinical Narrative: Aida Puffer plan#[id number];detailed#[id number], auth ref#[id number] [phone] re aware. Will fax d/c summary.Then call PTAR.  No further CM needs.    Final next level of care: Skilled Nursing Facility Barriers to Discharge: No Barriers Identified   Patient Goals and CMS Choice Patient states their goals for this hospitalization and ongoing recovery are:: go to rehab CMS Medicare.gov Compare Post Acute Care list provided to:: Patient Represenative (must comment)(Son) Choice offered to / list presented to : Adult Children  Discharge Placement PASRR number recieved: 10/09/19            Patient chooses bed at: [facility] Patient to be transferred to facility by: [place] Name of family member notified: Yuzuki Splain son [id number] Patient and family notified of of transfer: 10/09/19  Discharge Plan and Services   Discharge Planning Services: CM Consult                                 Social Determinants of Health (SDOH) Interventions     Readmission Risk Interventions No flowsheet data found.

## 2019-10-09 NOTE — Care Management Important Message (Signed)
Important Message  Patient Details IM Letter given to Dessa Phi RN Case Manager to present to the Patient Name: Alicia Harrison MRN: [id number] Date of Birth: 11/13/1925   Medicare Important Message Given:  Yes     Kerin Salen 10/09/2019, 11:52 AM

## 2019-10-09 NOTE — Progress Notes (Signed)
NT and I attempted multiple times to get patient OOB to chair for meal, patient refusing and kept stating "just let me die and leave me alone"

## 2019-10-09 NOTE — Discharge Summary (Signed)
Physician Discharge Summary   Patient ID: Alicia Harrison MRN: [id number] DOB/AGE: 1925-07-15 86 y.o.  Admit date: 10/02/2019 Discharge date: 10/09/2019  Primary Care Physician:  Haywood Pao, MD   Recommendations for Outpatient Follow-up:  1. Follow up with PCP in 1-2 weeks  Home Health: Patient being discharged to skilled nursing facility Equipment/Devices:   Discharge Condition: stable  CODE STATUS: FULL or DNR   Diet recommendation: Soft diet   Discharge Diagnoses:     Acute metabolic encephalopathy superimposed on dementia E. coli UTI Essential hypertension, new diagnosis Generalized debility with falls Dementia Hypokalemia, Mild acute kidney injury, hypovolemia  Consults: None    Allergies:  No Known Allergies   DISCHARGE MEDICATIONS: Allergies as of 10/09/2019   No Known Allergies     Medication List    TAKE these medications   acetaminophen 325 MG tablet Commonly known as: TYLENOL Take 325-650 mg by mouth every 6 (six) hours as needed for mild pain, moderate pain or headache.   amLODipine 10 MG tablet Commonly known as: NORVASC Take 1 tablet (10 mg total) by mouth daily. Notes to patient: 10/09/19   donepezil 10 MG tablet Commonly known as: ARICEPT Take 1 tablet (10 mg total) by mouth at bedtime. Notes to patient: 10/08/19   memantine 10 MG tablet Commonly known as: Namenda Take 1 tablet (10 mg total) by mouth 2 (two) times daily. Notes to patient: 10/08/19        Brief H and P: For complete details please refer to admission H and P, but in brief  Admit date:10/02/2019 86 y.o.female,with history of dementia who uses walker and a cane at baseline, brought in by son after an unwitnessed fall / found on the floor with AMS. Usually she is able to carry out a conversation but has not been able to talk.EMS was called and patient brought to the ED for further evaluation.Home health aides take care of the patient but son also lives on  property and helps take care of her ED Course: Afebrile, BP elevated at systolic [id number] to [id number], diastolic in [id number], WBC [id number], renal function okay, found to have abnormal UA, started on IV ceftriaxone. Imaging studies including CT head and CT cervical spine was negative. CXR, pelvic x-ray was unremarkable.    Hospital Course:  Acute metabolic encephalopathy, superimposed on dementia -Likely worsened due to E. coli UTI -Alert and awake, appears close to her baseline  E. coli UTI Urine culture showed E. coli, patient was initially placed on IV Rocephin, transitioned to oral cefdinir on 5/21 -Patient has completed antibiotic course, does not need any further antibiotics  Hypertension -New diagnosis, not on any home meds prior to admission -Continue Norvasc 10 mg daily  Generalized debility, fall -Likely mechanical versus UTI related, work-up in ED negative -Per patient's son has been falling almost every week for last few months -Bed available at [facility], SNF,  -COVID-19 test 10/07/2019 negative  Dementia Has not been taking home meds Aricept, Namenda, resumed  Hypokalemia, mild acute renal insufficiency  -Creatinine 1.4, baseline 1.18-1.2  -patient was placed on IV fluid hydration  repeat BMET pending - Avoid nephrotoxic medications   Day of Discharge S: Alert and awake, wants to be left alone, no fevers or chills.  BP 133/76 (BP Location: Left Arm)   Pulse 89   Temp 99.1 F (37.3 C) (Oral)   Resp 18   Ht 5\' 7"  (1.702 m)   Wt 61.7 kg   LMP 05/17/1988  SpO2 95%   BMI 21.30 kg/m   Physical Exam:  General: Alert and awake, does not want to answer any questions  Cardiovascular: S1 S2 clear, RRR. No pedal edema b/l  Respiratory: CTAB, no wheezing, rales or rhonchi  Gastrointestinal: Soft, nontender, nondistended, NBS  Ext: no pedal edema bilaterally  Neuro: Moving all 4 extremities  Musculoskeletal: No cyanosis, clubbing  Skin: No rashes  Psych: Alert  and awake, flat affect   Get Medicines reviewed and adjusted: Please take all your medications with you for your next visit with your Primary MD  Please request your Primary MD to go over all hospital tests and procedure/radiological results at the follow up. Please ask your Primary MD to get all Hospital records sent to his/her office.  If you experience worsening of your admission symptoms, develop shortness of breath, life threatening emergency, suicidal or homicidal thoughts you must seek medical attention immediately by calling 911 or calling your MD immediately  if symptoms less severe.  You must read complete instructions/literature along with all the possible adverse reactions/side effects for all the Medicines you take and that have been prescribed to you. Take any new Medicines after you have completely understood and accept all the possible adverse reactions/side effects.   Do not drive when taking pain medications.   Do not take more than prescribed Pain, Sleep and Anxiety Medications  Special Instructions: If you have smoked or chewed Tobacco  in the last 2 yrs please stop smoking, stop any regular Alcohol  and or any Recreational drug use.  Wear Seat belts while driving.  Please note  You were cared for by a hospitalist during your hospital stay. Once you are discharged, your primary care physician will handle any further medical issues. Please note that NO REFILLS for any discharge medications will be authorized once you are discharged, as it is imperative that you return to your primary care physician (or establish a relationship with a primary care physician if you do not have one) for your aftercare needs so that they can reassess your need for medications and monitor your lab values.   The results of significant diagnostics from this hospitalization (including imaging, microbiology, ancillary and laboratory) are listed below for reference.       Procedures/Studies:  CT Head Wo Contrast  Result Date: 10/02/2019 CLINICAL DATA:  Recent fall EXAM: CT HEAD WITHOUT CONTRAST CT CERVICAL SPINE WITHOUT CONTRAST TECHNIQUE: Multidetector CT imaging of the head and cervical spine was performed following the standard protocol without intravenous contrast. Multiplanar CT image reconstructions of the cervical spine were also generated. COMPARISON:  08/18/2019 FINDINGS: CT HEAD FINDINGS Brain: Mild atrophic changes and chronic white matter ischemic changes are noted. No findings to suggest acute hemorrhage, acute infarction or space-occupying mass lesion are seen. Vascular: No hyperdense vessel or unexpected calcification. Skull: Normal. Negative for fracture or focal lesion. Sinuses/Orbits: No acute finding. Other: None. CT CERVICAL SPINE FINDINGS Alignment: Mild anterolisthesis of C4 on C5 of a degenerative nature is noted. Anterolisthesis of C7 on T1 is noted also of a degenerative nature. Skull base and vertebrae: 7 cervical segments are well visualized. Vertebral body height is well maintained. The odontoid is within normal limits. Disc space narrowing is seen from C5-C7. Mild associated osteophytic changes are noted. Multilevel facet hypertrophic changes are seen. No acute fracture is noted. Soft tissues and spinal canal: Surrounding soft tissue structures appear within normal limits. Upper chest: Visualized lung apices are unremarkable. Other: None IMPRESSION: CT of the head:  Chronic atrophic and ischemic changes without acute abnormality. CT of the cervical spine: Multilevel degenerative change with mild anterolisthesis. No acute abnormality noted. Electronically Signed   By: Inez Catalina M.D.   On: 10/02/2019 12:08   CT Cervical Spine Wo Contrast  Result Date: 10/02/2019 CLINICAL DATA:  Recent fall EXAM: CT HEAD WITHOUT CONTRAST CT CERVICAL SPINE WITHOUT CONTRAST TECHNIQUE: Multidetector CT imaging of the head and cervical spine was performed  following the standard protocol without intravenous contrast. Multiplanar CT image reconstructions of the cervical spine were also generated. COMPARISON:  08/18/2019 FINDINGS: CT HEAD FINDINGS Brain: Mild atrophic changes and chronic white matter ischemic changes are noted. No findings to suggest acute hemorrhage, acute infarction or space-occupying mass lesion are seen. Vascular: No hyperdense vessel or unexpected calcification. Skull: Normal. Negative for fracture or focal lesion. Sinuses/Orbits: No acute finding. Other: None. CT CERVICAL SPINE FINDINGS Alignment: Mild anterolisthesis of C4 on C5 of a degenerative nature is noted. Anterolisthesis of C7 on T1 is noted also of a degenerative nature. Skull base and vertebrae: 7 cervical segments are well visualized. Vertebral body height is well maintained. The odontoid is within normal limits. Disc space narrowing is seen from C5-C7. Mild associated osteophytic changes are noted. Multilevel facet hypertrophic changes are seen. No acute fracture is noted. Soft tissues and spinal canal: Surrounding soft tissue structures appear within normal limits. Upper chest: Visualized lung apices are unremarkable. Other: None IMPRESSION: CT of the head: Chronic atrophic and ischemic changes without acute abnormality. CT of the cervical spine: Multilevel degenerative change with mild anterolisthesis. No acute abnormality noted. Electronically Signed   By: Inez Catalina M.D.   On: 10/02/2019 12:08   DG Pelvis Portable  Result Date: 10/02/2019 CLINICAL DATA:  Recent fall with hip pain, initial encounter EXAM: PORTABLE PELVIS 1 VIEWS COMPARISON:  None. FINDINGS: Pelvic ring is intact. No acute fracture is noted. No soft tissue abnormality is seen. IMPRESSION: No acute abnormality noted. Electronically Signed   By: Inez Catalina M.D.   On: 10/02/2019 11:47   DG Chest Portable 1 View  Result Date: 10/02/2019 CLINICAL DATA:  Fall EXAM: PORTABLE CHEST 1 VIEW COMPARISON:  08/18/2019  FINDINGS: The heart size and mediastinal contours are within normal limits. Both lungs are clear. No pleural effusion or pneumothorax. The visualized skeletal structures are grossly intact. Advanced degenerative changes at the left glenohumeral joint. IMPRESSION: No acute process in the chest. Electronically Signed   By: Macy Mis M.D.   On: 10/02/2019 11:46      LAB RESULTS: Basic Metabolic Panel: Recent Labs  Lab 10/08/19 0814 10/09/19 0530  NA 140 142  K 3.1* 3.1*  CL 107 109  CO2 25 26  GLUCOSE 101* 100*  BUN 19 30*  CREATININE 0.98 1.40*  CALCIUM 8.6* 8.8*   Liver Function Tests: Recent Labs  Lab 10/03/19 0436  AST 25  ALT 16  ALKPHOS 71  BILITOT 0.9  PROT 6.3*  ALBUMIN 3.5   No results for input(s): LIPASE, AMYLASE in the last 168 hours. No results for input(s): AMMONIA in the last 168 hours. CBC: Recent Labs  Lab 10/03/19 0436 10/03/19 0436 10/07/19 0456  WBC 8.8  --  8.2  HGB 15.9*  --  12.3  HCT 47.7*  --  37.5  MCV 92.1   < > 93.1  PLT 169  --  260   < > = values in this interval not displayed.   Cardiac Enzymes: No results for input(s): CKTOTAL, CKMB, CKMBINDEX,  TROPONINI in the last 168 hours. BNP: Invalid input(s): POCBNP CBG: No results for input(s): GLUCAP in the last 168 hours.     Disposition and Follow-up: Discharge Instructions    Diet - low sodium heart healthy   Complete by: As directed    Increase activity slowly   Complete by: As directed        DISPOSITION: [place] information for follow-up providers    Tisovec, Fransico Him, MD Follow up in 2 week(s).   Specialty: Internal Medicine Contact information: [place] [address] [phone]            Contact information for after-discharge care    Destination    [facility] .   Service: Skilled Nursing Contact information: [place] [phone]                   Time coordinating discharge:  66mins  minutes  Signed:   Estill Cotta M.D. [facility] 10/09/2019, 12:57 PM

## 2019-10-09 NOTE — Progress Notes (Signed)
Report called to [facility] at [facility] , pt going to [facility], [facility] to transport, patient belongings at bedside

## 2019-10-09 NOTE — Plan of Care (Signed)

## 2021-03-05 IMAGING — CT CT CERVICAL SPINE W/O CM
3 of 4 series · 12 of 35 positions shown, 14 images · non-contrast
Comparison: 08/18/2019

CLINICAL DATA: Recent fall

EXAM:
CT HEAD WITHOUT CONTRAST
CT CERVICAL SPINE WITHOUT CONTRAST
TECHNIQUE: Multidetector CT imaging of the head and cervical spine was
performed following the standard protocol without intravenous
contrast. Multiplanar CT image reconstructions of the cervical spine
were also generated.

[Series 6: orthogonal bone · axial · 0.23mm/px · z∈[-348,-211]mm · 4 of 111 slices shown, 5 images]
[im 19/111  soft-tissue]
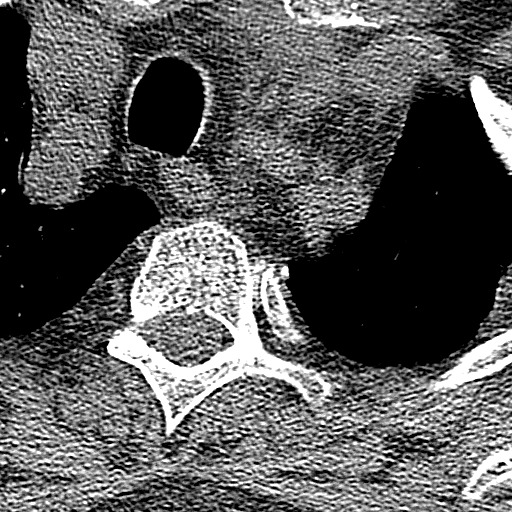
[im 19/111  bone]
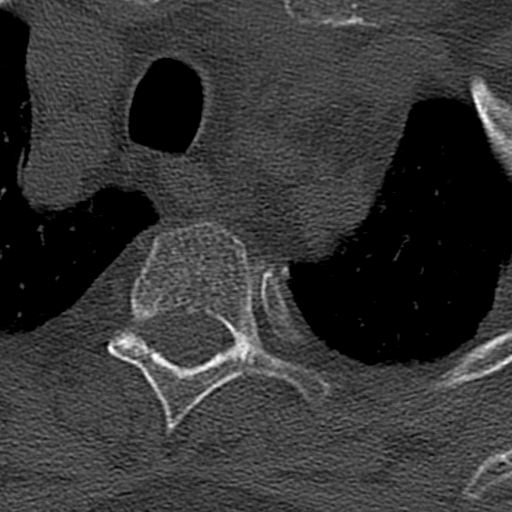
[im 37/111  bone]
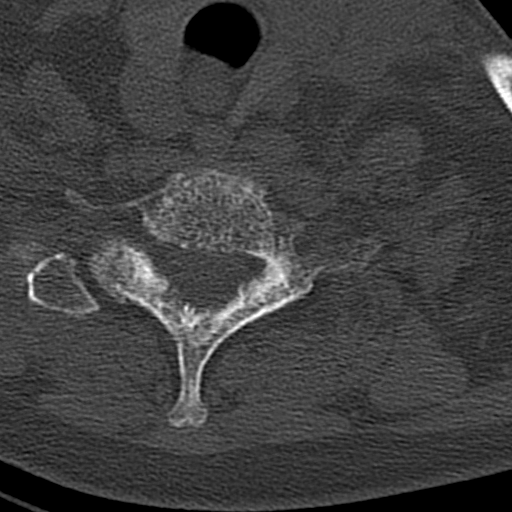
[im 74/111  bone]
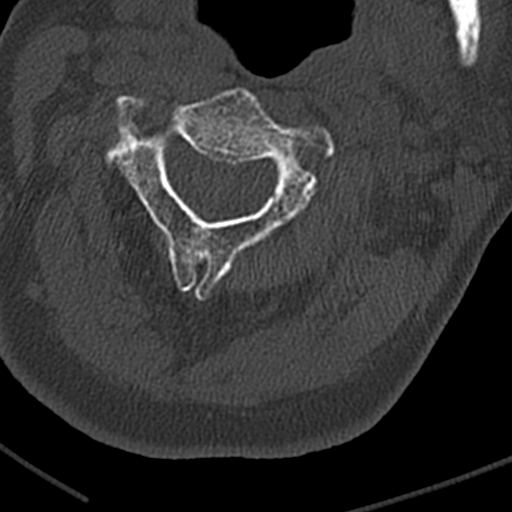
[im 92/111  bone]
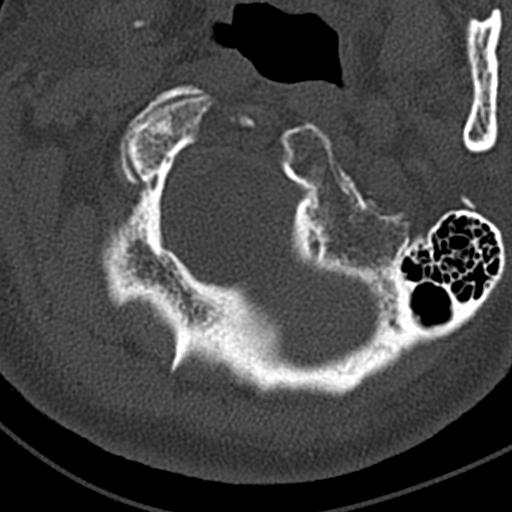

[Series 7: coronal bone · coronal · 0.29mm/px · 3 of 70 slices shown]
[im 14/70  bone]
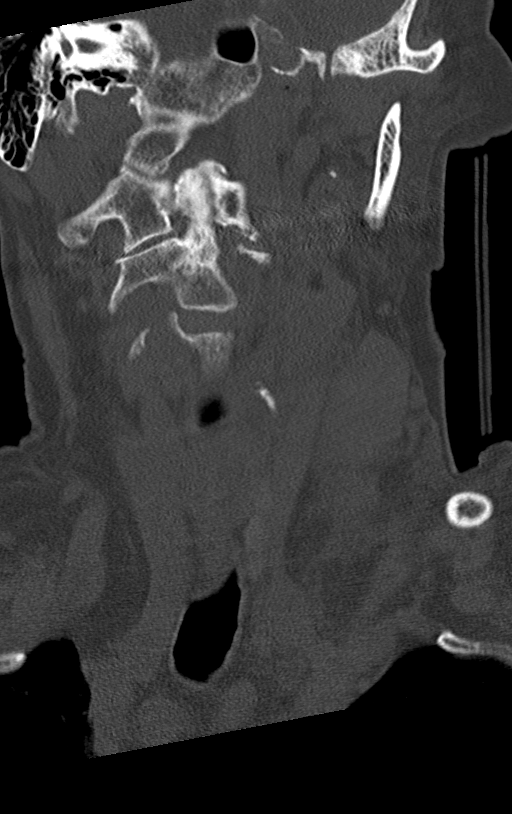
[im 28/70  bone]
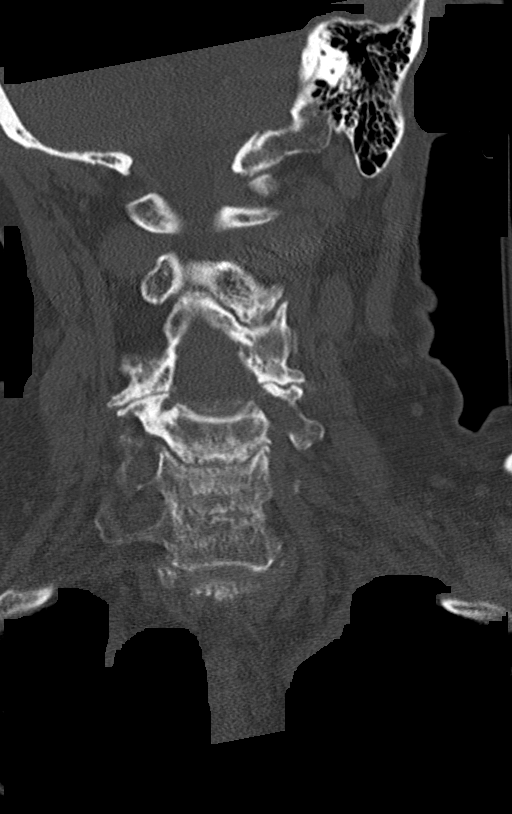
[im 42/70  bone]
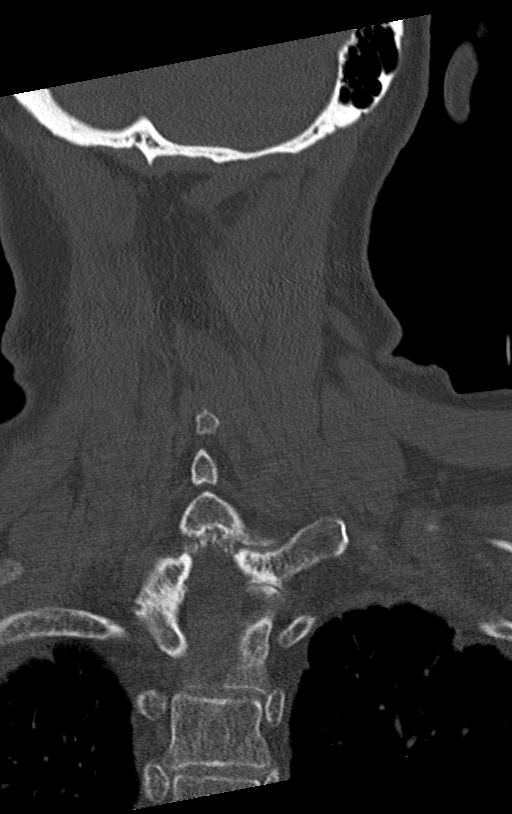

[Series 8: sagittal bone · sagittal · 0.30mm/px · 5 of 61 slices shown, 6 images]
[im 21/61  bone]
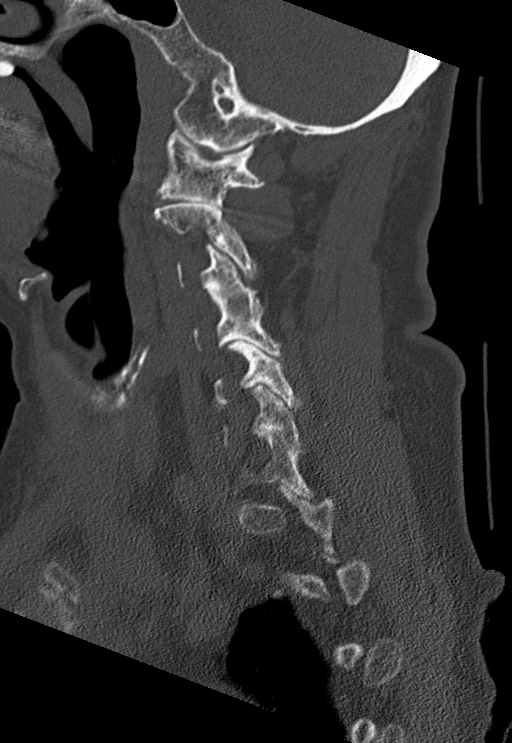
[im 26/61  bone]
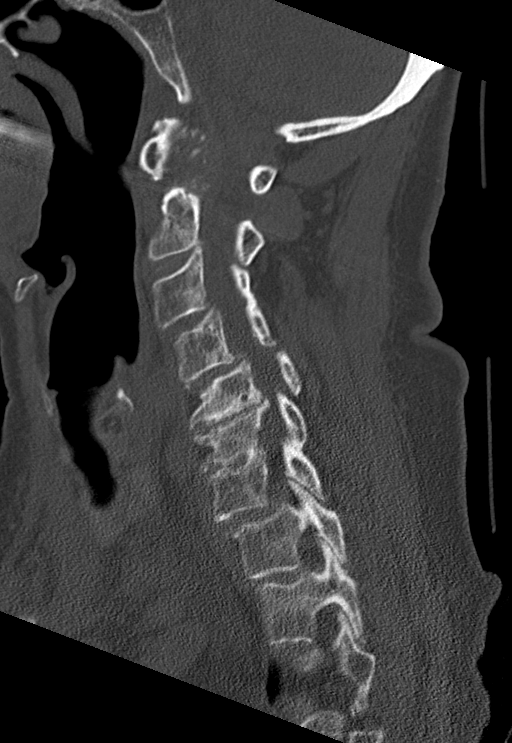
[im 31/61  soft-tissue]
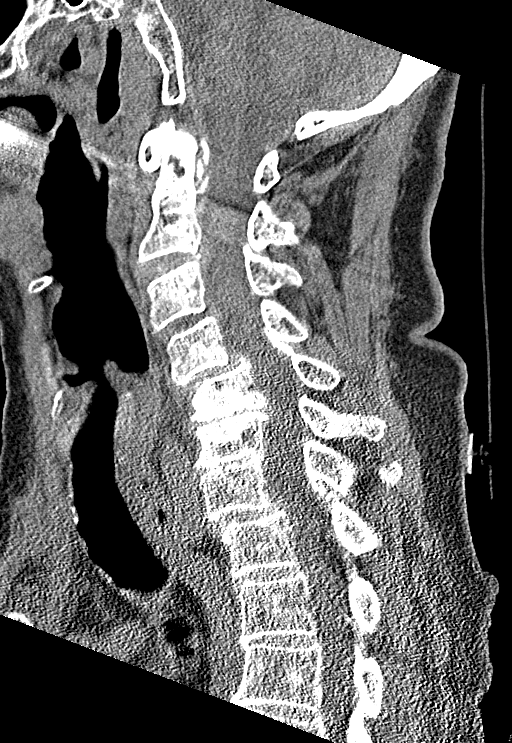
[im 31/61  bone]
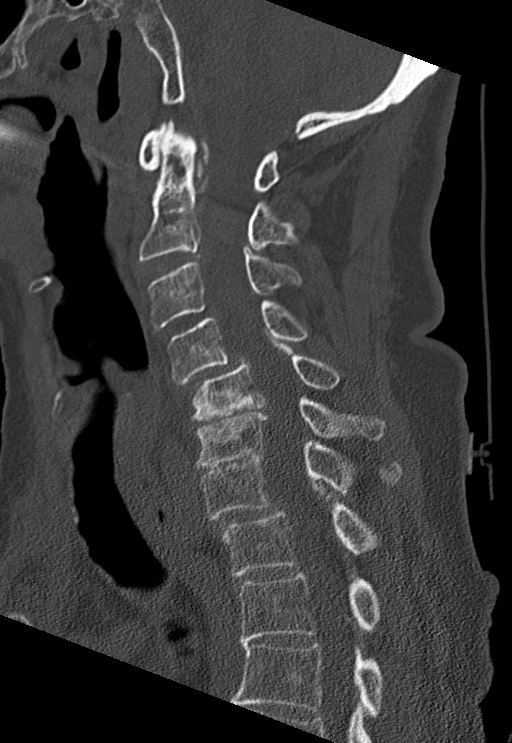
[im 36/61  bone]
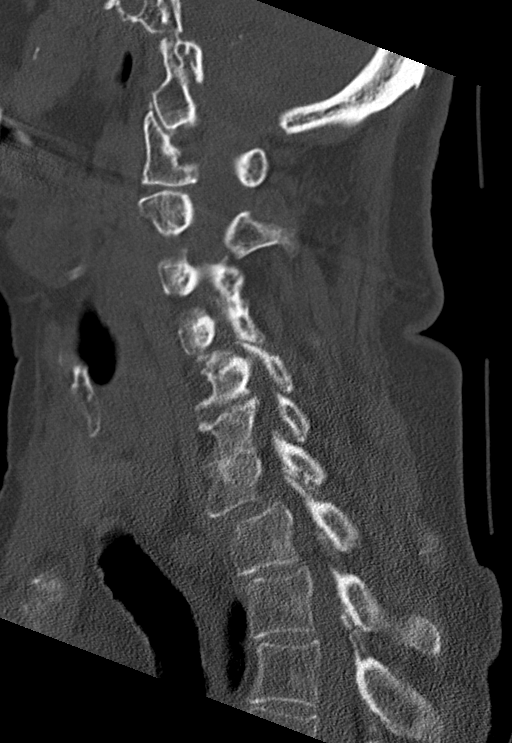
[im 41/61  bone]
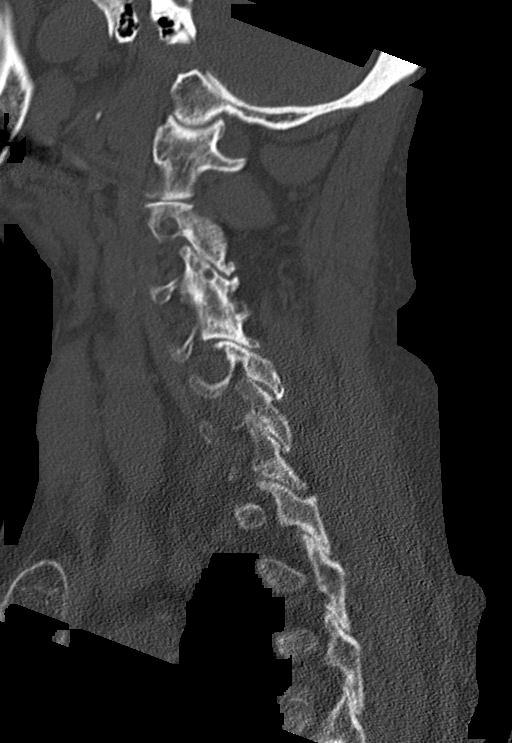

[12 of 35 positions shown; findings below may reference images not displayed]

FINDINGS: CT HEAD FINDINGS

Brain: Mild atrophic changes and chronic white matter ischemic
changes are noted. No findings to suggest acute hemorrhage, acute
infarction or space-occupying mass lesion are seen.

Vascular: No hyperdense vessel or unexpected calcification.

Skull: Normal. Negative for fracture or focal lesion.

Sinuses/Orbits: No acute finding.

Other: None.

CT CERVICAL SPINE FINDINGS

Alignment: Mild anterolisthesis of C4 on C5 of a degenerative nature
is noted. Anterolisthesis of C7 on T1 is noted also of a
degenerative nature.

Skull base and vertebrae: 7 cervical segments are well visualized.
Vertebral body height is well maintained. The odontoid is within
normal limits. Disc space narrowing is seen from C5-C7. Mild
associated osteophytic changes are noted. Multilevel facet
hypertrophic changes are seen. No acute fracture is noted.

Soft tissues and spinal canal: Surrounding soft tissue structures
appear within normal limits.

Upper chest: Visualized lung apices are unremarkable.

Other: None
IMPRESSION: CT of the head: Chronic atrophic and ischemic changes without acute
abnormality.

CT of the cervical spine: Multilevel degenerative change with mild
anterolisthesis. No acute abnormality noted.

## 2023-05-18 DEATH — deceased
